# Patient Record
Sex: Female | Born: 1945
Health system: Southern US, Community
[De-identification: ages and names within clinical notes are randomized; demographics above are authoritative.]

## PROBLEM LIST (undated history)

## (undated) DIAGNOSIS — N3010 Interstitial cystitis (chronic) without hematuria: Secondary | ICD-10-CM

## (undated) HISTORY — PX: KIDNEY SURGERY: SHX687

## (undated) HISTORY — PX: VAGINAL HYSTERECTOMY: SUR661

## (undated) HISTORY — PX: BREAST LUMPECTOMY: SHX2

## (undated) HISTORY — DX: Interstitial cystitis (chronic) without hematuria: N30.10

---

## 2009-05-11 ENCOUNTER — Ambulatory Visit: Payer: Self-pay | Admitting: Orthopedic Surgery

## 2009-05-19 ENCOUNTER — Ambulatory Visit: Payer: Self-pay | Admitting: Orthopedic Surgery

## 2015-04-09 ENCOUNTER — Ambulatory Visit (INDEPENDENT_AMBULATORY_CARE_PROVIDER_SITE_OTHER): Payer: PPO | Admitting: Family Medicine

## 2015-04-09 ENCOUNTER — Encounter: Payer: Self-pay | Admitting: Family Medicine

## 2015-04-09 VITALS — BP 130/68 | HR 80 | Ht 65.0 in | Wt 159.0 lb

## 2015-04-09 DIAGNOSIS — Z7689 Persons encountering health services in other specified circumstances: Secondary | ICD-10-CM

## 2015-04-09 DIAGNOSIS — Z7189 Other specified counseling: Secondary | ICD-10-CM | POA: Diagnosis not present

## 2015-04-09 NOTE — Progress Notes (Signed)
Name: BASILIA STUCKERT   MRN: [id number]    DOB: 04-04-1946   Date:04/09/2015       Progress Note  Subjective  Chief Complaint  Chief Complaint  Patient presents with  . Establish Care    HPI Comments: Review aspects of medical aspects. No subjective or objective concerns.  Other This is a new (transition to care) problem. Associated symptoms include headaches. Pertinent negatives include no abdominal pain, anorexia, arthralgias, change in bowel habit, chest pain, chills, congestion, coughing, diaphoresis, fatigue, fever, joint swelling, myalgias, nausea, neck pain, numbness, rash, sore throat, swollen glands, urinary symptoms, vertigo, visual change, vomiting or weakness. Nothing aggravates the symptoms. She has tried nothing for the symptoms. The treatment provided no relief.    No problem-specific assessment & plan notes found for this encounter.   Past Medical History  Diagnosis Date  . Interstitial cystitis     Past Surgical History  Procedure Laterality Date  . Vaginal hysterectomy    . Kidney surgery      Family History  Problem Relation Age of Onset  . Hypertension Mother     History   Social History  . Marital Status: Married    Spouse Name: N/A  . Number of Children: N/A  . Years of Education: N/A   Occupational History  . Not on file.   Social History Main Topics  . Smoking status: Former Games developer  . Smokeless tobacco: Not on file  . Alcohol Use: 0.0 oz/week    0 Standard drinks or equivalent per week  . Drug Use: No  . Sexual Activity: Yes   Other Topics Concern  . Not on file   Social History Narrative  . No narrative on file    Allergies  Allergen Reactions  . Sulfa Antibiotics      Review of Systems  Constitutional: Negative for fever, chills, weight loss, malaise/fatigue, diaphoresis and fatigue.  HENT: Negative for congestion, ear discharge, ear pain and sore throat.   Eyes: Negative for blurred vision.  Respiratory: Negative for cough,  sputum production, shortness of breath and wheezing.   Cardiovascular: Negative for chest pain, palpitations and leg swelling.  Gastrointestinal: Negative for heartburn, nausea, vomiting, abdominal pain, diarrhea, constipation, blood in stool, melena, anorexia and change in bowel habit.  Genitourinary: Negative for dysuria, urgency, frequency and hematuria.       Hx of interstial cystitis  Musculoskeletal: Negative for myalgias, back pain, joint pain, joint swelling, arthralgias and neck pain.  Skin: Negative for rash.  Neurological: Positive for headaches. Negative for dizziness, vertigo, tingling, sensory change, focal weakness, weakness and numbness.  Endo/Heme/Allergies: Negative for environmental allergies and polydipsia. Does not bruise/bleed easily.  Psychiatric/Behavioral: Negative for depression and suicidal ideas. The patient is not nervous/anxious and does not have insomnia.      Objective  Filed Vitals:   04/09/15 1358  BP: 130/68  Pulse: 80  Height:  (1.651 m)  Weight: 159 lb (72.122 kg)    Physical Exam  Constitutional: She is well-developed, well-nourished, and in no distress. No distress.  HENT:  Head: Normocephalic and atraumatic.  Right Ear: External ear normal.  Left Ear: External ear normal.  Nose: Nose normal.  Mouth/Throat: Oropharynx is clear and moist.  Eyes: Conjunctivae and EOM are normal. Pupils are equal, round, and reactive to light. Right eye exhibits no discharge. Left eye exhibits no discharge.  Neck: Normal range of motion. Neck supple. No JVD present. No thyromegaly present.  Cardiovascular: Normal rate, regular rhythm, normal  heart sounds and intact distal pulses.  Exam reveals no gallop and no friction rub.   No murmur heard. Pulmonary/Chest: Effort normal and breath sounds normal. No respiratory distress. She has no wheezes.  Abdominal: Soft. Bowel sounds are normal. She exhibits no mass. There is no tenderness. There is no guarding.   Musculoskeletal: Normal range of motion. She exhibits no edema.  Lymphadenopathy:    She has no cervical adenopathy.  Neurological: She is alert. She has normal reflexes.  Skin: Skin is warm and dry. She is not diaphoretic.  Psychiatric: Mood and affect normal.  Nursing note and vitals reviewed.     Assessment & Plan  Problem List Items Addressed This Visit    None    Visit Diagnoses    Establishing care with new doctor, encounter for    -  Primary    Relevant Orders    Ambulatory referral to Gastroenterology         Dr. Elizabeth Sauer [facility]  04/09/2015

## 2015-07-21 ENCOUNTER — Ambulatory Visit (INDEPENDENT_AMBULATORY_CARE_PROVIDER_SITE_OTHER): Payer: PPO | Admitting: Family Medicine

## 2015-07-21 ENCOUNTER — Encounter: Payer: Self-pay | Admitting: Family Medicine

## 2015-07-21 VITALS — BP 122/80 | HR 72 | Ht 66.0 in | Wt 154.0 lb

## 2015-07-21 DIAGNOSIS — Z0000 Encounter for general adult medical examination without abnormal findings: Secondary | ICD-10-CM | POA: Diagnosis not present

## 2015-07-21 NOTE — Progress Notes (Signed)
Name: Heidi Bauer   MRN: [id number]    DOB: 23-Dec-1945   Date:07/21/2015       Progress Note  Subjective  Chief Complaint  Chief Complaint  Patient presents with  . Annual Exam    MAW    HPI Comments: Patient for medicare annual wellness.   No problem-specific assessment & plan notes found for this encounter.   Past Medical History  Diagnosis Date  . Interstitial cystitis     Past Surgical History  Procedure Laterality Date  . Vaginal hysterectomy    . Kidney surgery    . Breast lumpectomy      Family History  Problem Relation Age of Onset  . Hypertension Mother     Social History   Social History  . Marital Status: Married    Spouse Name: N/A  . Number of Children: N/A  . Years of Education: N/A   Occupational History  . Not on file.   Social History Main Topics  . Smoking status: Former Games developermoker  . Smokeless tobacco: Not on file  . Alcohol Use: 0.0 oz/week    0 Standard drinks or equivalent per week  . Drug Use: No  . Sexual Activity: Yes   Other Topics Concern  . Not on file   Social History Narrative    Allergies  Allergen Reactions  . Sulfa Antibiotics      Review of Systems  Constitutional: Negative for fever, chills, weight loss and malaise/fatigue.  HENT: Negative for ear discharge, ear pain and sore throat.   Eyes: Negative for blurred vision.  Respiratory: Negative for cough, sputum production, shortness of breath and wheezing.   Cardiovascular: Negative for chest pain, palpitations and leg swelling.  Gastrointestinal: Negative for heartburn, nausea, abdominal pain, diarrhea, constipation, blood in stool and melena.  Genitourinary: Negative for dysuria, urgency, frequency and hematuria.  Musculoskeletal: Positive for joint pain. Negative for myalgias, back pain and neck pain.  Skin: Negative for rash.  Neurological: Negative for dizziness, tingling, sensory change, focal weakness and headaches.  Endo/Heme/Allergies: Negative for  environmental allergies and polydipsia. Does not bruise/bleed easily.  Psychiatric/Behavioral: Negative for depression and suicidal ideas. The patient is not nervous/anxious and does not have insomnia.      Objective  Filed Vitals:   07/21/15 0957  BP: 122/80  Pulse: 72  Height: 5\' 6"  (1.676 m)  Weight: 154 lb (69.854 kg)      Physical Exam  Constitutional: She is well-developed, well-nourished, and in no distress. No distress.  HENT:  Head: Normocephalic and atraumatic.  Right Ear: External ear normal.  Left Ear: External ear normal.  Nose: Nose normal.  Mouth/Throat: Oropharynx is clear and moist.  Eyes: Conjunctivae and EOM are normal. Pupils are equal, round, and reactive to light. Right eye exhibits no discharge. Left eye exhibits no discharge.  Neck: Normal range of motion. Neck supple. No JVD present. No thyromegaly present.  Cardiovascular: Normal rate, regular rhythm, normal heart sounds and intact distal pulses.  Exam reveals no gallop and no friction rub.   No murmur heard. Pulmonary/Chest: Effort normal and breath sounds normal. Right breast exhibits no inverted nipple, no mass, no nipple discharge, no skin change and no tenderness. Left breast exhibits no inverted nipple, no mass, no nipple discharge, no skin change and no tenderness. Breasts are symmetrical.  Abdominal: Soft. Bowel sounds are normal. She exhibits no mass. There is no tenderness. There is no guarding.  Musculoskeletal: Normal range of motion. She exhibits no edema.  Lymphadenopathy:    She has no cervical adenopathy.  Neurological: She is alert. She has normal reflexes.  Skin: Skin is warm and dry. She is not diaphoretic.  Psychiatric: Mood and affect normal.  Nursing note and vitals reviewed.     Assessment & Plan  Problem List Items Addressed This Visit    None    Visit Diagnoses    Medicare annual wellness visit, subsequent    -  Primary    deperession/cognitive screen normal       Pt refused TDAP, Pneumonia vaccine, and flu vaccine. Also stated she would call us after first of year to discuss getting colonoscopy and mammogram   Dr. Elizabeth Sauer [facility]  07/21/2015

## 2015-07-21 NOTE — Patient Instructions (Signed)

## 2015-09-12 LAB — COLOGUARD: Cologuard: NEGATIVE

## 2017-04-18 DIAGNOSIS — R739 Hyperglycemia, unspecified: Secondary | ICD-10-CM | POA: Diagnosis not present

## 2017-04-18 DIAGNOSIS — K219 Gastro-esophageal reflux disease without esophagitis: Secondary | ICD-10-CM | POA: Diagnosis not present

## 2017-04-18 DIAGNOSIS — E782 Mixed hyperlipidemia: Secondary | ICD-10-CM | POA: Diagnosis not present

## 2017-04-18 DIAGNOSIS — D500 Iron deficiency anemia secondary to blood loss (chronic): Secondary | ICD-10-CM | POA: Diagnosis not present

## 2017-04-18 DIAGNOSIS — K5900 Constipation, unspecified: Secondary | ICD-10-CM | POA: Diagnosis not present

## 2017-04-23 ENCOUNTER — Ambulatory Visit (INDEPENDENT_AMBULATORY_CARE_PROVIDER_SITE_OTHER): Payer: PPO

## 2017-04-23 VITALS — BP 138/80 | HR 62 | Temp 97.7°F | Resp 16 | Ht 66.0 in | Wt 157.0 lb

## 2017-04-23 DIAGNOSIS — Z0000 Encounter for general adult medical examination without abnormal findings: Secondary | ICD-10-CM | POA: Diagnosis not present

## 2017-04-23 NOTE — Progress Notes (Signed)
Subjective:   Heidi Bauer is a 71 y.o. female who presents for Medicare Annual (Subsequent) preventive examination.  Review of Systems:  Cardiac Risk Factors include: advanced age (>3755men, 71>65 women)     Objective:     Vitals: BP 138/80 (BP Location: Right Arm, Patient Position: Sitting)   Pulse 62   Temp 97.7 F (36.5 C)   Resp 16   Ht 5\' 6"  (1.676 m)   Wt 157 lb (71.2 kg)   BMI 25.34 kg/m   Body mass index is 25.34 kg/m.   Tobacco History  Smoking Status  . Former Smoker  Smokeless Tobacco  . Never Used    Comment: unknown quit date      Counseling given: Not Answered   Past Medical History:  Diagnosis Date  . Interstitial cystitis    Past Surgical History:  Procedure Laterality Date  . BREAST LUMPECTOMY    . KIDNEY SURGERY    . VAGINAL HYSTERECTOMY     Family History  Problem Relation Age of Onset  . Hypertension Mother    History  Sexual Activity  . Sexual activity: Yes    Outpatient Encounter Prescriptions as of 04/23/2017  Medication Sig  . Naproxen Sodium (ALEVE) 220 MG CAPS Take by mouth. As needed   No facility-administered encounter medications on file as of 04/23/2017.     Activities of Daily Living In your present state of health, do you have any difficulty performing the following activities: 04/23/2017  Hearing? N  Vision? N  Difficulty concentrating or making decisions? N  Walking or climbing stairs? N  Dressing or bathing? N  Doing errands, shopping? N  Preparing Food and eating ? N  Using the Toilet? N  In the past six months, have you accidently leaked urine? N  Do you have problems with loss of bowel control? N  Managing your Medications? N  Managing your Finances? N  Housekeeping or managing your Housekeeping? N  Some recent data might be hidden    Patient Care Team: Duanne LimerickJones, Deanna C, MD as PCP - General (Family Medicine)    Assessment:     Exercise Activities and Dietary recommendations Current Exercise Habits:  Home exercise routine, Type of exercise: walking, Time (Minutes): 20, Frequency (Times/Week): 7, Weekly Exercise (Minutes/Week): 140, Intensity: Mild  Goals    None     Fall Risk Fall Risk  04/23/2017 07/21/2015 04/09/2015  Falls in the past year? No No No   Depression Screen PHQ 2/9 Scores 04/23/2017 07/21/2015 07/21/2015 04/09/2015  PHQ - 2 Score 0 0 0 0     Cognitive Function     6CIT Screen 04/23/2017  What Year? 0 points  What month? 0 points  What time? 0 points  Count back from 20 0 points  Months in reverse 0 points  Repeat phrase 0 points  Total Score 0     There is no immunization history on file for this patient. Screening Tests Health Maintenance  Topic Date Due  . INFLUENZA VACCINE  04/11/2017  . MAMMOGRAM  05/12/2018 (Originally 05/06/1997)  . DEXA SCAN  05/12/2018 (Originally 05/06/2012)  . COLONOSCOPY  05/12/2018 (Originally 05/06/1997)  . TETANUS/TDAP  05/12/2018 (Originally 05/06/1966)  . Hepatitis C Screening  05/12/2018 (Originally 01-07-1946)  . PNA vac Low Risk Adult (1 of 2 - PCV13) 05/12/2018 (Originally 05/06/2012)      Plan:     I have personally reviewed and addressed the Medicare Annual Wellness questionnaire and have noted the following in  the patient's chart:  A. Medical and social history B. Use of alcohol, tobacco or illicit drugs  C. Current medications and supplements D. Functional ability and status E.  Nutritional status F.  Physical activity G. Advance directives H. List of other physicians I.  Hospitalizations, surgeries, and ER visits in previous 12 months J.  Vitals K. Screenings such as hearing and vision if needed, cognitive and depression L. Referrals and appointments  In addition, I have reviewed and discussed with patient certain preventive protocols, quality metrics, and best practice recommendations. A written personalized care plan for preventive services as well as general preventive health recommendations were provided to  patient.   Signed,  Marin Roberts, LPN Nurse Health Advisor   MD Recommendations: requests cologuard

## 2017-04-23 NOTE — Patient Instructions (Signed)
[name] , Thank you for taking time to come for your Medicare Wellness Visit. I appreciate your ongoing commitment to your health goals. Please review the following plan we discussed and let me know if I can assist you in the future.   Screening recommendations/referrals: Colonoscopy: requests cologuard Mammogram: declined Bone Density: declined  Recommended yearly ophthalmology/optometry visit for glaucoma screening and checkup Recommended yearly dental visit for hygiene and checkup  Vaccinations: Influenza vaccine: up to date  Pneumococcal vaccine: declined Tdap vaccine: declined Shingles vaccine: declined  Advanced directives: Please bring a copy of your health care power of attorney and living will to the office at your convenience.  Conditions/risks identified: none   Next appointment: Follow up in one year for your annual wellness visit.    Preventive Care 1765 Years and Older, Female Preventive care refers to lifestyle choices and visits with your health care provider that can promote health and wellness. What does preventive care include?  A yearly physical exam. This is also called an annual well check.  Dental exams once or twice a year.  Routine eye exams. Ask your health care provider how often you should have your eyes checked.  Personal lifestyle choices, including:  Daily care of your teeth and gums.  Regular physical activity.  Eating a healthy diet.  Avoiding tobacco and drug use.  Limiting alcohol use.  Practicing safe sex.  Taking low-dose aspirin every day.  Taking vitamin and mineral supplements as recommended by your health care provider. What happens during an annual well check? The services and screenings done by your health care provider during your annual well check will depend on your age, overall health, lifestyle risk factors, and family history of disease. Counseling  Your health care provider may ask you questions about  your:  Alcohol use.  Tobacco use.  Drug use.  Emotional well-being.  Home and relationship well-being.  Sexual activity.  Eating habits.  History of falls.  Memory and ability to understand (cognition).  Work and work Astronomerenvironment.  Reproductive health. Screening  You may have the following tests or measurements:  Height, weight, and BMI.  Blood pressure.  Lipid and cholesterol levels. These may be checked every 5 years, or more frequently if you are over 70 years old.  Skin check.  Lung cancer screening. You may have this screening every year starting at age 70 if you have a 30-pack-year history of smoking and currently smoke or have quit within the past 15 years.  Fecal occult blood test (FOBT) of the stool. You may have this test every year starting at age 70.  Flexible sigmoidoscopy or colonoscopy. You may have a sigmoidoscopy every 5 years or a colonoscopy every 10 years starting at age 70.  Hepatitis C blood test.  Hepatitis B blood test.  Sexually transmitted disease (STD) testing.  Diabetes screening. This is done by checking your blood sugar (glucose) after you have not eaten for a while (fasting). You may have this done every 1-3 years.  Bone density scan. This is done to screen for osteoporosis. You may have this done starting at age 70.  Mammogram. This may be done every 1-2 years. Talk to your health care provider about how often you should have regular mammograms. Talk with your health care provider about your test results, treatment options, and if necessary, the need for more tests. Vaccines  Your health care provider may recommend certain vaccines, such as:  Influenza vaccine. This is recommended every year.  Tetanus,  diphtheria, and acellular pertussis (Tdap, Td) vaccine. You may need a Td booster every 10 years.  Zoster vaccine. You may need this after age 65.  Pneumococcal 13-valent conjugate (PCV13) vaccine. One dose is recommended  after age 30.  Pneumococcal polysaccharide (PPSV23) vaccine. One dose is recommended after age 71. Talk to your health care provider about which screenings and vaccines you need and how often you need them. This information is not intended to replace advice given to you by your health care provider. Make sure you discuss any questions you have with your health care provider. Document Released: 09/24/2015 Document Revised: 05/17/2016 Document Reviewed: 06/29/2015 Elsevier Interactive Patient Education  2017 [place] Prevention in the Home Falls can cause injuries. They can happen to people of all ages. There are many things you can do to make your home safe and to help prevent falls. What can I do on the outside of my home?  Regularly fix the edges of walkways and driveways and fix any cracks.  Remove anything that might make you trip as you walk through a door, such as a raised step or threshold.  Trim any bushes or trees on the path to your home.  Use bright outdoor lighting.  Clear any walking paths of anything that might make someone trip, such as rocks or tools.  Regularly check to see if handrails are loose or broken. Make sure that both sides of any steps have handrails.  Any raised decks and porches should have guardrails on the edges.  Have any leaves, snow, or ice cleared regularly.  Use sand or salt on walking paths during winter.  Clean up any spills in your garage right away. This includes oil or grease spills. What can I do in the bathroom?  Use night lights.  Install grab bars by the toilet and in the tub and shower. Do not use towel bars as grab bars.  Use non-skid mats or decals in the tub or shower.  If you need to sit down in the shower, use a plastic, non-slip stool.  Keep the floor dry. Clean up any water that spills on the floor as soon as it happens.  Remove soap buildup in the tub or shower regularly.  Attach bath mats securely with  double-sided non-slip rug tape.  Do not have throw rugs and other things on the floor that can make you trip. What can I do in the bedroom?  Use night lights.  Make sure that you have a light by your bed that is easy to reach.  Do not use any sheets or blankets that are too big for your bed. They should not hang down onto the floor.  Have a firm chair that has side arms. You can use this for support while you get dressed.  Do not have throw rugs and other things on the floor that can make you trip. What can I do in the kitchen?  Clean up any spills right away.  Avoid walking on wet floors.  Keep items that you use a lot in easy-to-reach places.  If you need to reach something above you, use a strong step stool that has a grab bar.  Keep electrical cords out of the way.  Do not use floor polish or wax that makes floors slippery. If you must use wax, use non-skid floor wax.  Do not have throw rugs and other things on the floor that can make you trip. What can I do with  my stairs?  Do not leave any items on the stairs.  Make sure that there are handrails on both sides of the stairs and use them. Fix handrails that are broken or loose. Make sure that handrails are as long as the stairways.  Check any carpeting to make sure that it is firmly attached to the stairs. Fix any carpet that is loose or worn.  Avoid having throw rugs at the top or bottom of the stairs. If you do have throw rugs, attach them to the floor with carpet tape.  Make sure that you have a light switch at the top of the stairs and the bottom of the stairs. If you do not have them, ask someone to add them for you. What else can I do to help prevent falls?  Wear shoes that:  Do not have high heels.  Have rubber bottoms.  Are comfortable and fit you well.  Are closed at the toe. Do not wear sandals.  If you use a stepladder:  Make sure that it is fully opened. Do not climb a closed stepladder.  Make  sure that both sides of the stepladder are locked into place.  Ask someone to hold it for you, if possible.  Clearly mark and make sure that you can see:  Any grab bars or handrails.  First and last steps.  Where the edge of each step is.  Use tools that help you move around (mobility aids) if they are needed. These include:  Canes.  Walkers.  Scooters.  Crutches.  Turn on the lights when you go into a dark area. Replace any light bulbs as soon as they burn out.  Set up your furniture so you have a clear path. Avoid moving your furniture around.  If any of your floors are uneven, fix them.  If there are any pets around you, be aware of where they are.  Review your medicines with your doctor. Some medicines can make you feel dizzy. This can increase your chance of falling. Ask your doctor what other things that you can do to help prevent falls. This information is not intended to replace advice given to you by your health care provider. Make sure you discuss any questions you have with your health care provider. Document Released: 06/24/2009 Document Revised: 02/03/2016 Document Reviewed: 10/02/2014 Elsevier Interactive Patient Education  2017 [employer].

## 2017-07-10 ENCOUNTER — Other Ambulatory Visit: Payer: Self-pay

## 2018-01-15 ENCOUNTER — Other Ambulatory Visit: Payer: Self-pay

## 2018-04-24 ENCOUNTER — Ambulatory Visit (INDEPENDENT_AMBULATORY_CARE_PROVIDER_SITE_OTHER): Payer: PPO

## 2018-04-24 VITALS — BP 128/70 | HR 78 | Temp 98.0°F | Resp 12 | Ht 66.0 in | Wt 164.8 lb

## 2018-04-24 DIAGNOSIS — Z0000 Encounter for general adult medical examination without abnormal findings: Secondary | ICD-10-CM | POA: Diagnosis not present

## 2018-04-24 NOTE — Patient Instructions (Signed)
Heidi Bauer , Thank you for taking time to come for your Medicare Wellness Visit. I appreciate your ongoing commitment to your health goals. Please review the following plan we discussed and let me know if I can assist you in the future.   Screening recommendations/referrals: Colorectal Screening: Declined Mammogram: Declined Bone Density: Declined  Vision and Dental Exams: Recommended annual ophthalmology exams for early detection of glaucoma and other disorders of the eye Recommended annual dental exams for proper oral hygiene  Vaccinations: Influenza vaccine: Overdue Pneumococcal vaccine: Declined Tdap vaccine: Declined. Please call your insurance company to determine your out of pocket expense. You may also receive this vaccine at your local pharmacy or Health Dept. Shingles vaccine: Please call your insurance company to determine your out of pocket expense for the Shingrix vaccine. You may also receive this vaccine at your local pharmacy or Health Dept.  Advanced directives: Please bring a copy of your POA (Power of Attorney) and/or Living Will to your next appointment.  Goals: Recommend to drink at least 6-8 8oz glasses of water per day.  Next appointment: Please schedule your Annual Wellness Visit with your Nurse Health Advisor in one year.  Preventive Care 40-64 Years, Female Preventive care refers to lifestyle choices and visits with your health care provider that can promote health and wellness. What does preventive care include?  A yearly physical exam. This is also called an annual well check.  Dental exams once or twice a year.  Routine eye exams. Ask your health care provider how often you should have your eyes checked.  Personal lifestyle choices, including:  Daily care of your teeth and gums.  Regular physical activity.  Eating a healthy diet.  Avoiding tobacco and drug use.  Limiting alcohol use.  Practicing safe sex.  Taking low-dose aspirin daily  starting at age 32.  Taking vitamin and mineral supplements as recommended by your health care provider. What happens during an annual well check? The services and screenings done by your health care provider during your annual well check will depend on your age, overall health, lifestyle risk factors, and family history of disease. Counseling  Your health care provider may ask you questions about your:  Alcohol use.  Tobacco use.  Drug use.  Emotional well-being.  Home and relationship well-being.  Sexual activity.  Eating habits.  Work and work Statistician.  Method of birth control.  Menstrual cycle.  Pregnancy history. Screening  You may have the following tests or measurements:  Height, weight, and BMI.  Blood pressure.  Lipid and cholesterol levels. These may be checked every 5 years, or more frequently if you are over 65 years old.  Skin check.  Lung cancer screening. You may have this screening every year starting at age 37 if you have a 30-pack-year history of smoking and currently smoke or have quit within the past 15 years.  Fecal occult blood test (FOBT) of the stool. You may have this test every year starting at age 29.  Flexible sigmoidoscopy or colonoscopy. You may have a sigmoidoscopy every 5 years or a colonoscopy every 10 years starting at age 57.  Hepatitis C blood test.  Hepatitis B blood test.  Sexually transmitted disease (STD) testing.  Diabetes screening. This is done by checking your blood sugar (glucose) after you have not eaten for a while (fasting). You may have this done every 1-3 years.  Mammogram. This may be done every 1-2 years. Talk to your health care provider about when you should  start having regular mammograms. This may depend on whether you have a family history of breast cancer.  BRCA-related cancer screening. This may be done if you have a family history of breast, ovarian, tubal, or peritoneal cancers.  Pelvic exam and  Pap test. This may be done every 3 years starting at age 24. Starting at age 47, this may be done every 5 years if you have a Pap test in combination with an HPV test.  Bone density scan. This is done to screen for osteoporosis. You may have this scan if you are at high risk for osteoporosis. Discuss your test results, treatment options, and if necessary, the need for more tests with your health care provider. Vaccines  Your health care provider may recommend certain vaccines, such as:  Influenza vaccine. This is recommended every year.  Tetanus, diphtheria, and acellular pertussis (Tdap, Td) vaccine. You may need a Td booster every 10 years.  Zoster vaccine. You may need this after age 49.  Pneumococcal 13-valent conjugate (PCV13) vaccine. You may need this if you have certain conditions and were not previously vaccinated.  Pneumococcal polysaccharide (PPSV23) vaccine. You may need one or two doses if you smoke cigarettes or if you have certain conditions. Talk to your health care provider about which screenings and vaccines you need and how often you need them. This information is not intended to replace advice given to you by your health care provider. Make sure you discuss any questions you have with your health care provider. Document Released: 09/24/2015 Document Revised: 05/17/2016 Document Reviewed: 06/29/2015 Elsevier Interactive Patient Education  2017 [place] Prevention in the Home Falls can cause injuries. They can happen to people of all ages. There are many things you can do to make your home safe and to help prevent falls. What can I do on the outside of my home?  Regularly fix the edges of walkways and driveways and fix any cracks.  Remove anything that might make you trip as you walk through a door, such as a raised step or threshold.  Trim any bushes or trees on the path to your home.  Use bright outdoor lighting.  Clear any walking paths of  anything that might make someone trip, such as rocks or tools.  Regularly check to see if handrails are loose or broken. Make sure that both sides of any steps have handrails.  Any raised decks and porches should have guardrails on the edges.  Have any leaves, snow, or ice cleared regularly.  Use sand or salt on walking paths during winter.  Clean up any spills in your garage right away. This includes oil or grease spills. What can I do in the bathroom?  Use night lights.  Install grab bars by the toilet and in the tub and shower. Do not use towel bars as grab bars.  Use non-skid mats or decals in the tub or shower.  If you need to sit down in the shower, use a plastic, non-slip stool.  Keep the floor dry. Clean up any water that spills on the floor as soon as it happens.  Remove soap buildup in the tub or shower regularly.  Attach bath mats securely with double-sided non-slip rug tape.  Do not have throw rugs and other things on the floor that can make you trip. What can I do in the bedroom?  Use night lights.  Make sure that you have a light by your bed that is easy  to reach.  Do not use any sheets or blankets that are too big for your bed. They should not hang down onto the floor.  Have a firm chair that has side arms. You can use this for support while you get dressed.  Do not have throw rugs and other things on the floor that can make you trip. What can I do in the kitchen?  Clean up any spills right away.  Avoid walking on wet floors.  Keep items that you use a lot in easy-to-reach places.  If you need to reach something above you, use a strong step stool that has a grab bar.  Keep electrical cords out of the way.  Do not use floor polish or wax that makes floors slippery. If you must use wax, use non-skid floor wax.  Do not have throw rugs and other things on the floor that can make you trip. What can I do with my stairs?  Do not leave any items on the  stairs.  Make sure that there are handrails on both sides of the stairs and use them. Fix handrails that are broken or loose. Make sure that handrails are as long as the stairways.  Check any carpeting to make sure that it is firmly attached to the stairs. Fix any carpet that is loose or worn.  Avoid having throw rugs at the top or bottom of the stairs. If you do have throw rugs, attach them to the floor with carpet tape.  Make sure that you have a light switch at the top of the stairs and the bottom of the stairs. If you do not have them, ask someone to add them for you. What else can I do to help prevent falls?  Wear shoes that:  Do not have high heels.  Have rubber bottoms.  Are comfortable and fit you well.  Are closed at the toe. Do not wear sandals.  If you use a stepladder:  Make sure that it is fully opened. Do not climb a closed stepladder.  Make sure that both sides of the stepladder are locked into place.  Ask someone to hold it for you, if possible.  Clearly mark and make sure that you can see:  Any grab bars or handrails.  First and last steps.  Where the edge of each step is.  Use tools that help you move around (mobility aids) if they are needed. These include:  Canes.  Walkers.  Scooters.  Crutches.  Turn on the lights when you go into a dark area. Replace any light bulbs as soon as they burn out.  Set up your furniture so you have a clear path. Avoid moving your furniture around.  If any of your floors are uneven, fix them.  If there are any pets around you, be aware of where they are.  Review your medicines with your doctor. Some medicines can make you feel dizzy. This can increase your chance of falling. Ask your doctor what other things that you can do to help prevent falls. This information is not intended to replace advice given to you by your health care provider. Make sure you discuss any questions you have with your health care  provider. Document Released: 06/24/2009 Document Revised: 02/03/2016 Document Reviewed: 10/02/2014 Elsevier Interactive Patient Education  2017 [employer].

## 2018-04-24 NOTE — Progress Notes (Signed)
Subjective:   Heidi Bauer is a 72 y.o. female who presents for Medicare Annual (Subsequent) preventive examination.  Review of Systems:  N/A Cardiac Risk Factors include: advanced age (>4855men, 5>65 women)     Objective:     Vitals: BP 128/70 (BP Location: Right Arm, Patient Position: Sitting, Cuff Size: Normal)   Pulse 78   Temp 98 F (36.7 C) (Oral)   Resp 12   Ht 5\' 6"  (1.676 m)   Wt 164 lb 12.8 oz (74.8 kg)   SpO2 96%   BMI 26.60 kg/m   Body mass index is 26.6 kg/m.  Advanced Directives 04/24/2018 04/23/2017 07/21/2015 04/09/2015  Does Patient Have a Medical Advance Directive? Yes Yes Yes Yes  Type of Estate agentAdvance Directive Healthcare Power of [place];Living will Healthcare Power of [place];Living will Healthcare Power of [place];Living will Living will  Copy of Healthcare Power of Attorney in Chart? No - copy requested No - copy requested No - copy requested No - copy requested    Tobacco Social History   Tobacco Use  Smoking Status Former Smoker  . Packs/day: 0.25  . Years: 15.00  . Pack years: 3.75  . Types: Cigarettes  . Last attempt to quit: 2004  . Years since quitting: 15.6  Smokeless Tobacco Never Used  Tobacco Comment   smoking cessation materials not required     Counseling given: No Comment: smoking cessation materials not required  Clinical Intake:  Pre-visit preparation completed: Yes  Pain : No/denies pain   BMI - recorded: 26.6 Nutritional Status: BMI 25 -29 Overweight Nutritional Risks: None Diabetes: No  How often do you need to have someone help you when you read instructions, pamphlets, or other written materials from your doctor or pharmacy?: 1 - Never  Interpreter Needed?: No  Information entered by :: AEversole, LPN  Past Medical History:  Diagnosis Date  . Interstitial cystitis    Past Surgical History:  Procedure Laterality Date  . BREAST LUMPECTOMY    . KIDNEY SURGERY    . VAGINAL HYSTERECTOMY     Family History    Problem Relation Age of Onset  . Hypertension Mother    Social History   Socioeconomic History  . Marital status: Married    Spouse name: Not on file  . Number of children: 2  . Years of education: Not on file  . Highest education level: Associate degree: academic program  Occupational History  . Occupation: Retired  Engineer, productionocial Needs  . Financial resource strain: Not hard at all  . Food insecurity:    Worry: Never true    Inability: Never true  . Transportation needs:    Medical: No    Non-medical: No  Tobacco Use  . Smoking status: Former Smoker    Packs/day: 0.25    Years: 15.00    Pack years: 3.75    Types: Cigarettes    Last attempt to quit: 2004    Years since quitting: 15.6  . Smokeless tobacco: Never Used  . Tobacco comment: smoking cessation materials not required  Substance and Sexual Activity  . Alcohol use: Not Currently    Alcohol/week: 0.0 standard drinks  . Drug use: No  . Sexual activity: Yes  Lifestyle  . Physical activity:    Days per week: 7 days    Minutes per session: 30 min  . Stress: Not at all  Relationships  . Social connections:    Talks on phone: Patient refused    Gets together: Patient refused  Attends religious service: Patient refused    Active member of club or organization: Patient refused    Attends meetings of clubs or organizations: Patient refused    Relationship status: Married  Other Topics Concern  . Not on file  Social History Narrative  . Not on file    Outpatient Encounter Medications as of 04/24/2018  Medication Sig  . Naproxen Sodium (ALEVE) 220 MG CAPS Take by mouth. As needed   No facility-administered encounter medications on file as of 04/24/2018.     Activities of Daily Living In your present state of health, do you have any difficulty performing the following activities: 04/24/2018  Hearing? N  Comment denies hearing aids  Vision? N  Comment wears eyeglasses  Difficulty concentrating or making  decisions? N  Walking or climbing stairs? N  Dressing or bathing? N  Doing errands, shopping? N  Preparing Food and eating ? N  Comment full set upper and lower dentures  Using the Toilet? N  In the past six months, have you accidently leaked urine? N  Do you have problems with loss of bowel control? N  Managing your Medications? N  Managing your Finances? N  Housekeeping or managing your Housekeeping? N  Some recent data might be hidden    Patient Care Team: Duanne Limerick, MD as PCP - General (Family Medicine)    Assessment:   This is a routine wellness examination for [facility].  Exercise Activities and Dietary recommendations Current Exercise Habits: The patient does not participate in regular exercise at present;Home exercise routine, Type of exercise: walking, Time (Minutes): 30, Frequency (Times/Week): 7, Weekly Exercise (Minutes/Week): 210, Intensity: Mild, Exercise limited by: None identified  Goals    . DIET - INCREASE WATER INTAKE     Recommend to drink at least 6-8 8oz glasses of water per day.       Fall Risk Fall Risk  04/24/2018 04/23/2017 07/21/2015 04/09/2015  Falls in the past year? No No No No  Risk for fall due to : Impaired vision - - -  Risk for fall due to: Comment wears eyeglasses - - -   FALL RISK PREVENTION PERTAINING TO HOME: Is your home free of loose throw rugs in walkways, pet beds, electrical cords, etc? Yes Is there adequate lighting in your home to reduce risk of falls?  Yes Are there stairs in or around your home WITH handrails? Yes  ASSISTIVE DEVICES UTILIZED TO PREVENT FALLS: Use of a cane, walker or w/c? No Grab bars in the bathroom? No  Shower chair or a place to sit while bathing? No An elevated toilet seat or a handicapped toilet? No  Timed Get Up and Go Performed: Yes. Pt ambulated 10 feet within 5 sec. Gait stead-fast and without the use of an assistive device. No intervention required at this time. Fall risk prevention has been  discussed.  Community Resource Referral:  Pt declined my offer to send [employer] Referral to Care Guide for installation of grab bars in the shower, shower chair or an elevated toilet seat.  Depression Screen PHQ 2/9 Scores 04/24/2018 04/23/2017 07/21/2015 07/21/2015  PHQ - 2 Score 0 0 0 0  PHQ- 9 Score 0 - - -     Cognitive Function     6CIT Screen 04/24/2018 04/23/2017  What Year? 0 points 0 points  What month? 0 points 0 points  What time? 0 points 0 points  Count back from 20 0 points 0 points  Months in reverse  0 points 0 points  Repeat phrase 0 points 0 points  Total Score 0 0     There is no immunization history on file for this patient.  Qualifies for Shingles Vaccine? Yes. Due for Shingrix. Education has been provided regarding the importance of this vaccine. Pt has been advised to call insurance company to determine out of pocket expense. Advised may also receive vaccine at local pharmacy or Health Dept. Verbalized acceptance and understanding.  Overdue for Flu vaccine. Education has been provided regarding the importance of this vaccine and advised to receive when available. Verbalized acceptance and understanding.  Due for Pneumoccocal vaccine. Declined my offer to administer today. Education has been provided regarding the importance of this vaccine but still declined. Advised may receive this vaccine at local pharmacy or Health Dept. Aware to provide a copy of the vaccination record if obtained from local pharmacy or Health Dept. Verbalized acceptance and understanding.  Due for Tdap vaccine. Education has been provided regarding the importance of this vaccine. Advised may receive this vaccine at local pharmacy or Health Dept. Aware to provide a copy of the vaccination record if obtained from local pharmacy or Health Dept. Verbalized acceptance and understanding.   Screening Tests Health Maintenance  Topic Date Due  . INFLUENZA VACCINE  04/11/2018  .  MAMMOGRAM  05/12/2018 (Originally 05/06/1997)  . DEXA SCAN  05/12/2018 (Originally 05/06/2012)  . COLONOSCOPY  05/12/2018 (Originally 05/06/1997)  . TETANUS/TDAP  05/12/2018 (Originally 05/06/1966)  . Hepatitis C Screening  05/12/2018 (Originally 03/15/1946)  . PNA vac Low Risk Adult (1 of 2 - PCV13) 05/12/2018 (Originally 05/06/2012)    Cancer Screenings: Lung: Low Dose CT Chest recommended if Age 75-80 years, 30 pack-year currently smoking OR have quit w/in 15years. Patient does not qualify. Breast:  Up to date on Mammogram? No. Declined   Up to date of Bone Density/Dexa? No. Declined Colorectal: Declined  Additional Screenings: Hepatitis C Screening: Declined    Plan:  I have personally reviewed and addressed the Medicare Annual Wellness questionnaire and have noted the following in the patient's chart:  A. Medical and social history B. Use of alcohol, tobacco or illicit drugs  C. Current medications and supplements D. Functional ability and status E.  Nutritional status F.  Physical activity G. Advance directives H. List of other physicians I.  Hospitalizations, surgeries, and ER visits in previous 12 months J.  Vitals K. Screenings such as hearing and vision if needed, cognitive and depression L. Referrals and appointments  In addition, I have reviewed and discussed with patient certain preventive protocols, quality metrics, and best practice recommendations. A written personalized care plan for preventive services as well as general preventive health recommendations were provided to patient.  Signed,  Deon Pilling, LPN Nurse Health Advisor  MD Recommendations: Due for Shingrix. Education has been provided regarding the importance of this vaccine. Pt has been advised to call insurance company to determine out of pocket expense. Advised may also receive vaccine at local pharmacy or Health Dept. Verbalized acceptance and understanding.  Overdue for Flu vaccine. Education has  been provided regarding the importance of this vaccine and advised to receive when available. Verbalized acceptance and understanding.  Due for Pneumoccocal vaccine. Declined my offer to administer today. Education has been provided regarding the importance of this vaccine but still declined. Advised may receive this vaccine at local pharmacy or Health Dept. Aware to provide a copy of the vaccination record if obtained from local pharmacy or Health Dept. Verbalized acceptance  and understanding.  Due for Tdap vaccine. Education has been provided regarding the importance of this vaccine. Advised may receive this vaccine at local pharmacy or Health Dept. Aware to provide a copy of the vaccination record if obtained from local pharmacy or Health Dept. Verbalized acceptance and understanding.

## 2019-04-28 ENCOUNTER — Ambulatory Visit: Payer: PPO

## 2019-05-01 ENCOUNTER — Encounter: Payer: PPO | Admitting: Family Medicine

## 2019-05-05 DIAGNOSIS — Z20828 Contact with and (suspected) exposure to other viral communicable diseases: Secondary | ICD-10-CM | POA: Diagnosis not present

## 2019-05-06 DIAGNOSIS — Z20828 Contact with and (suspected) exposure to other viral communicable diseases: Secondary | ICD-10-CM | POA: Diagnosis not present

## 2019-05-08 ENCOUNTER — Ambulatory Visit (INDEPENDENT_AMBULATORY_CARE_PROVIDER_SITE_OTHER): Payer: PPO | Admitting: Family Medicine

## 2019-05-08 ENCOUNTER — Other Ambulatory Visit: Payer: Self-pay

## 2019-05-08 ENCOUNTER — Encounter: Payer: Self-pay | Admitting: Family Medicine

## 2019-05-08 VITALS — BP 130/80 | HR 80 | Ht 66.0 in | Wt 165.0 lb

## 2019-05-08 DIAGNOSIS — Z7689 Persons encountering health services in other specified circumstances: Secondary | ICD-10-CM | POA: Diagnosis not present

## 2019-05-08 NOTE — Progress Notes (Signed)
Date:  05/08/2019   Name:  Heidi Bauer   DOB:  [birth year]   MRN:  [id number]   Chief Complaint: [place] (get re-established)  Patient is a 73 year old female who presents for an establish care exam. The patient reports the following problems: none. Health maintenance has been reviewed up to date.   Review of Systems  Constitutional: Negative.  Negative for chills, fatigue, fever and unexpected weight change.  HENT: Negative for congestion, ear discharge, ear pain, rhinorrhea, sinus pressure, sneezing and sore throat.   Eyes: Negative for photophobia, pain, discharge, redness and itching.  Respiratory: Negative for cough, shortness of breath, wheezing and stridor.   Gastrointestinal: Negative for abdominal pain, blood in stool, constipation, diarrhea, nausea and vomiting.  Endocrine: Negative for cold intolerance, heat intolerance, polydipsia, polyphagia and polyuria.  Genitourinary: Negative for dysuria, flank pain, frequency, hematuria, menstrual problem, pelvic pain, urgency, vaginal bleeding and vaginal discharge.  Musculoskeletal: Negative for arthralgias, back pain and myalgias.  Skin: Negative for rash.  Allergic/Immunologic: Negative for environmental allergies and food allergies.  Neurological: Negative for dizziness, weakness, light-headedness, numbness and headaches.  Hematological: Negative for adenopathy. Does not bruise/bleed easily.  Psychiatric/Behavioral: Negative for dysphoric mood. The patient is not nervous/anxious.     There are no active problems to display for this patient.   Allergies  Allergen Reactions  . Sulfa Antibiotics     Past Surgical History:  Procedure Laterality Date  . BREAST LUMPECTOMY    . KIDNEY SURGERY    . VAGINAL HYSTERECTOMY      Social History   Tobacco Use  . Smoking status: Former Smoker    Packs/day: 0.25    Years: 15.00    Pack years: 3.75    Types: Cigarettes    Quit date: 2004    Years since quitting: 16.6   . Smokeless tobacco: Never Used  . Tobacco comment: smoking cessation materials not required  Substance Use Topics  . Alcohol use: Not Currently    Alcohol/week: 0.0 standard drinks  . Drug use: No     Medication list has been reviewed and updated.  Current Meds  Medication Sig  . Naproxen Sodium (ALEVE) 220 MG CAPS Take by mouth. As needed    PHQ 2/9 Scores 05/08/2019 04/24/2018 04/23/2017 07/21/2015  PHQ - 2 Score 0 0 0 0  PHQ- 9 Score 0 0 - -    BP Readings from Last 3 Encounters:  05/08/19 130/80  04/24/18 128/70  04/23/17 138/80    Physical Exam Vitals signs and nursing note reviewed.  Constitutional:      Appearance: She is well-developed.  HENT:     Head: Normocephalic.     Right Ear: Tympanic membrane, ear canal and external ear normal.     Left Ear: Tympanic membrane, ear canal and external ear normal.     Nose: Nose normal.     Mouth/Throat:     Mouth: Mucous membranes are moist.  Eyes:     General: Lids are everted, no foreign bodies appreciated. No scleral icterus.       Left eye: No foreign body or hordeolum.     Conjunctiva/sclera: Conjunctivae normal.     Right eye: Right conjunctiva is not injected.     Left eye: Left conjunctiva is not injected.     Pupils: Pupils are equal, round, and reactive to light.  Neck:     Musculoskeletal: Normal range of motion and neck supple.     Thyroid:  No thyromegaly.     Vascular: No JVD.     Trachea: No tracheal deviation.  Cardiovascular:     Rate and Rhythm: Normal rate and regular rhythm.     Pulses: Normal pulses.     Heart sounds: Normal heart sounds. No murmur. No friction rub. No gallop.   Pulmonary:     Effort: Pulmonary effort is normal. No respiratory distress.     Breath sounds: Normal breath sounds. No wheezing, rhonchi or rales.  Abdominal:     General: Bowel sounds are normal.     Palpations: Abdomen is soft. There is no mass.     Tenderness: There is no abdominal tenderness. There is no guarding  or rebound.  Musculoskeletal: Normal range of motion.        General: No tenderness.  Lymphadenopathy:     Cervical: No cervical adenopathy.  Skin:    General: Skin is warm.     Capillary Refill: Capillary refill takes less than 2 seconds.     Findings: No rash.  Neurological:     General: No focal deficit present.     Mental Status: She is alert and oriented to person, place, and time.     Cranial Nerves: No cranial nerve deficit.     Deep Tendon Reflexes: Reflexes normal.  Psychiatric:        Mood and Affect: Mood is not anxious or depressed.     Wt Readings from Last 3 Encounters:  05/08/19 165 lb (74.8 kg)  04/24/18 164 lb 12.8 oz (74.8 kg)  04/23/17 157 lb (71.2 kg)    BP 130/80   Pulse 80   Ht 5\' 6"  (1.676 m)   Wt 165 lb (74.8 kg)   BMI 26.63 kg/m   Assessment and Plan: 1. Establishing care with new doctor, encounter for Patient reestablishes care with physician.  Patient relates no medical concerns and although there are multiple areas that need to be updated in her health maintenance patient does not want to pursue any further proceeding of maintenance care.

## 2019-07-10 ENCOUNTER — Telehealth: Payer: Self-pay | Admitting: Family Medicine

## 2019-07-10 NOTE — Telephone Encounter (Signed)
Called to schedule Medicare Annual Wellness Visit with Nurse Health Advisor, Kasey Uthus at [facility]. If patient returns call, please schedule AWV with [facility] ~ Any date on [facility] schedule (Mon or Wed)  °Questions regarding scheduling, please call  [phone] or Skype > [email or web address]  ° °Kathryn Brown  °Care Guide • [facility] °[facility]  °??[email or web address]   ??[phone]   1Skype ° ° °

## 2019-08-16 DIAGNOSIS — Z20828 Contact with and (suspected) exposure to other viral communicable diseases: Secondary | ICD-10-CM | POA: Diagnosis not present

## 2019-08-29 ENCOUNTER — Telehealth: Payer: Self-pay

## 2019-08-29 NOTE — Telephone Encounter (Signed)
Pt returned call/ refuses mammo and colonoscopy

## 2019-10-06 DIAGNOSIS — H2513 Age-related nuclear cataract, bilateral: Secondary | ICD-10-CM | POA: Diagnosis not present

## 2019-10-15 DIAGNOSIS — Z20822 Contact with and (suspected) exposure to covid-19: Secondary | ICD-10-CM | POA: Diagnosis not present

## 2020-01-06 ENCOUNTER — Ambulatory Visit (INDEPENDENT_AMBULATORY_CARE_PROVIDER_SITE_OTHER): Payer: PPO | Admitting: Family Medicine

## 2020-01-06 ENCOUNTER — Encounter: Payer: Self-pay | Admitting: Family Medicine

## 2020-01-06 ENCOUNTER — Other Ambulatory Visit: Payer: Self-pay

## 2020-01-06 VITALS — BP 120/70 | HR 72 | Ht 66.0 in | Wt 165.0 lb

## 2020-01-06 DIAGNOSIS — Z1211 Encounter for screening for malignant neoplasm of colon: Secondary | ICD-10-CM | POA: Diagnosis not present

## 2020-01-06 DIAGNOSIS — Z780 Asymptomatic menopausal state: Secondary | ICD-10-CM | POA: Diagnosis not present

## 2020-01-06 DIAGNOSIS — Z0000 Encounter for general adult medical examination without abnormal findings: Secondary | ICD-10-CM

## 2020-01-06 NOTE — Progress Notes (Signed)
Date:  01/06/2020   Name:  Heidi Bauer   DOB:  [birth year]   MRN:  [id number]   Chief Complaint: Follow-up (check up for b/p)  Patient is a 74 year old female who presents for a comprehensive physical exam. The patient reports the following problems: none. Health maintenance has been reviewed colon/refuses  Mammogram/refuses  Dexascan/    No results found for: CREATININE, BUN, NA, K, CL, CO2 No results found for: CHOL, HDL, LDLCALC, LDLDIRECT, TRIG, CHOLHDL No results found for: TSH No results found for: HGBA1C No results found for: WBC, HGB, HCT, MCV, PLT No results found for: ALT, AST, GGT, ALKPHOS, BILITOT   Review of Systems  Constitutional: Negative.  Negative for chills, fatigue, fever and unexpected weight change.  HENT: Negative for congestion, ear discharge, ear pain, rhinorrhea, sinus pressure, sneezing and sore throat.   Eyes: Negative for photophobia, pain, discharge, redness and itching.  Respiratory: Negative for cough, shortness of breath, wheezing and stridor.   Cardiovascular: Negative for chest pain, palpitations and leg swelling.  Gastrointestinal: Negative for abdominal pain, blood in stool, constipation, diarrhea, nausea and vomiting.  Endocrine: Negative for cold intolerance, heat intolerance, polydipsia, polyphagia and polyuria.  Genitourinary: Negative for dysuria, flank pain, frequency, hematuria, menstrual problem, pelvic pain, urgency, vaginal bleeding and vaginal discharge.  Musculoskeletal: Negative for arthralgias, back pain and myalgias.  Skin: Negative for rash.  Allergic/Immunologic: Negative for environmental allergies and food allergies.  Neurological: Negative for dizziness, weakness, light-headedness, numbness and headaches.  Hematological: Negative for adenopathy. Does not bruise/bleed easily.  Psychiatric/Behavioral: Negative for dysphoric mood. The patient is not nervous/anxious.     There are no problems to display for this  patient.   Allergies  Allergen Reactions  . Sulfa Antibiotics     Past Surgical History:  Procedure Laterality Date  . BREAST LUMPECTOMY    . KIDNEY SURGERY    . VAGINAL HYSTERECTOMY      Social History   Tobacco Use  . Smoking status: Former Smoker    Packs/day: 0.25    Years: 15.00    Pack years: 3.75    Types: Cigarettes    Quit date: 2004    Years since quitting: 17.3  . Smokeless tobacco: Never Used  . Tobacco comment: smoking cessation materials not required  Substance Use Topics  . Alcohol use: Not Currently    Alcohol/week: 0.0 standard drinks  . Drug use: No     Medication list has been reviewed and updated.  Current Meds  Medication Sig  . Naproxen Sodium (ALEVE) 220 MG CAPS Take by mouth. As needed    PHQ 2/9 Scores 01/06/2020 05/08/2019 04/24/2018 04/23/2017  PHQ - 2 Score 0 0 0 0  PHQ- 9 Score 0 0 0 -    BP Readings from Last 3 Encounters:  01/06/20 120/70  05/08/19 130/80  04/24/18 128/70    Physical Exam Vitals and nursing note reviewed.  Constitutional:      General: She is not in acute distress.    Appearance: She is not diaphoretic.  HENT:     Head: Normocephalic and atraumatic.     Right Ear: Tympanic membrane, ear canal and external ear normal.     Left Ear: Tympanic membrane, ear canal and external ear normal.     Nose: Nose normal.  Eyes:     General:        Right eye: No discharge.        Left eye: No discharge.  Conjunctiva/sclera: Conjunctivae normal.     Pupils: Pupils are equal, round, and reactive to light.  Neck:     Thyroid: No thyromegaly.     Vascular: No JVD.  Cardiovascular:     Rate and Rhythm: Normal rate and regular rhythm.     Heart sounds: Normal heart sounds. No murmur. No friction rub. No gallop.   Pulmonary:     Effort: Pulmonary effort is normal.     Breath sounds: Normal breath sounds. No wheezing or rhonchi.  Abdominal:     General: Bowel sounds are normal.     Palpations: Abdomen is soft. There  is no hepatomegaly, splenomegaly or mass.     Tenderness: There is no abdominal tenderness. There is no guarding.  Musculoskeletal:        General: Normal range of motion.     Cervical back: Normal range of motion and neck supple.  Lymphadenopathy:     Cervical: No cervical adenopathy.  Skin:    General: Skin is warm and dry.     Coloration: Pallor:   Neurological:     Mental Status: She is alert.     Deep Tendon Reflexes: Reflexes are normal and symmetric.     Wt Readings from Last 3 Encounters:  01/06/20 165 lb (74.8 kg)  05/08/19 165 lb (74.8 kg)  04/24/18 164 lb 12.8 oz (74.8 kg)    BP 120/70   Pulse 72   Ht 5\' 6"  (1.676 m)   Wt 165 lb (74.8 kg)   BMI 26.63 kg/m   Assessment and Plan:    1. Health care maintenance Immunizations are reviewed and recommendations provided.   Age appropriate screening tests are discussed. Counseling given for risk factor reduction interventions.  2. Menopause Patient is postmenopausal and we will check for DEXA scan to rule out osteoporosis.  We have discussed with patient in the event that she does not wish to take medication that we can use supplemental vitamin D with calcium. - DG Bone Density; Future  3. Colon cancer screening Upon discussion with patient about colon cancer screening she has had Cologuard in the past but would prefer to proceed with the testing for fecal immunoassay - Fecal occult blood, imunochemical

## 2020-01-15 ENCOUNTER — Other Ambulatory Visit: Payer: Self-pay

## 2020-01-19 ENCOUNTER — Ambulatory Visit
Admission: RE | Admit: 2020-01-19 | Discharge: 2020-01-19 | Disposition: A | Discharge status: Home / Self Care | Payer: PPO | Source: Ambulatory Visit | Attending: Family Medicine | Admitting: Family Medicine

## 2020-01-19 DIAGNOSIS — Z780 Asymptomatic menopausal state: Secondary | ICD-10-CM | POA: Diagnosis not present

## 2020-01-19 DIAGNOSIS — M8589 Other specified disorders of bone density and structure, multiple sites: Secondary | ICD-10-CM | POA: Diagnosis not present

## 2020-01-24 LAB — FECAL OCCULT BLOOD, IMMUNOCHEMICAL

## 2020-01-26 ENCOUNTER — Telehealth: Payer: Self-pay

## 2020-01-26 NOTE — Telephone Encounter (Signed)
Called pt and left message on voicemail stating for her to call back with date that she collected her stool and what date did she drop it off at [facility]

## 2020-01-28 NOTE — Telephone Encounter (Signed)
Pt will pick up FIT on Monday

## 2020-01-28 NOTE — Telephone Encounter (Signed)
Patient stated she is returning a call to [place].  Please call patient back to discuss.

## 2020-02-02 ENCOUNTER — Other Ambulatory Visit: Payer: PPO

## 2020-02-02 DIAGNOSIS — Z1211 Encounter for screening for malignant neoplasm of colon: Secondary | ICD-10-CM

## 2020-02-03 DIAGNOSIS — Z1211 Encounter for screening for malignant neoplasm of colon: Secondary | ICD-10-CM | POA: Diagnosis not present

## 2020-02-05 LAB — FECAL OCCULT BLOOD, IMMUNOCHEMICAL: Fecal Occult Bld: NEGATIVE

## 2020-04-26 ENCOUNTER — Ambulatory Visit: Payer: PPO

## 2020-04-28 ENCOUNTER — Ambulatory Visit (INDEPENDENT_AMBULATORY_CARE_PROVIDER_SITE_OTHER): Payer: PPO

## 2020-04-28 DIAGNOSIS — Z0000 Encounter for general adult medical examination without abnormal findings: Secondary | ICD-10-CM | POA: Diagnosis not present

## 2020-04-28 NOTE — Progress Notes (Signed)
Subjective:   Heidi Bauer is a 74 y.o. female who presents for Medicare Annual (Subsequent) preventive examination.  Virtual Visit via Telephone Note  I connected with  Heidi Bauer on 04/28/20 at  1:20 PM EDT by telephone and verified that I am speaking with the correct person using two identifiers.  Medicare Annual Wellness visit completed telephonically due to Covid-19 pandemic.   Location: Patient: home Provider: [facility]   I discussed the limitations, risks, security and privacy concerns of performing an evaluation and management service by telephone and the availability of in person appointments. The patient expressed understanding and agreed to proceed.  Unable to perform video visit due to video visit attempted and failed and/or patient does not have video capability.   Some vital signs may be absent or patient reported.   Reather Littler, LPN    Review of Systems     Cardiac Risk Factors include: advanced age (>53men, >14 women)     Objective:    There were no vitals filed for this visit. There is no height or weight on file to calculate BMI.  Advanced Directives 04/28/2020 04/24/2018 04/23/2017 07/21/2015 04/09/2015  Does Patient Have a Medical Advance Directive? Yes Yes Yes Yes Yes  Type of Estate agent of [place];Living will Healthcare Power of [place];Living will Healthcare Power of [place];Living will Healthcare Power of [place];Living will Living will  Copy of Healthcare Power of Attorney in Chart? No - copy requested No - copy requested No - copy requested No - copy requested No - copy requested    Current Medications (verified) Outpatient Encounter Medications as of 04/28/2020  Medication Sig  . Calcium Carbonate-Vitamin D (CALCIUM 500 + D) 500-125 MG-UNIT TABS Take by mouth.  . Naproxen Sodium (ALEVE) 220 MG CAPS Take by mouth. As needed   No facility-administered encounter medications on file as of 04/28/2020.    Allergies  (verified) Sulfa antibiotics   History: Past Medical History:  Diagnosis Date  . Interstitial cystitis    Past Surgical History:  Procedure Laterality Date  . BREAST LUMPECTOMY    . KIDNEY SURGERY    . VAGINAL HYSTERECTOMY     Family History  Problem Relation Age of Onset  . Hypertension Mother    Social History   Socioeconomic History  . Marital status: Married    Spouse name: Not on file  . Number of children: 2  . Years of education: Not on file  . Highest education level: Associate degree: academic program  Occupational History  . Occupation: Retired  Tobacco Use  . Smoking status: Former Smoker    Packs/day: 0.25    Years: 15.00    Pack years: 3.75    Types: Cigarettes    Quit date: 2004    Years since quitting: 17.6  . Smokeless tobacco: Never Used  . Tobacco comment: smoking cessation materials not required  Vaping Use  . Vaping Use: Never used  Substance and Sexual Activity  . Alcohol use: Yes    Alcohol/week: 1.0 standard drink    Types: 1 Glasses of wine per week    Comment: occasionally  . Drug use: No  . Sexual activity: Yes  Other Topics Concern  . Not on file  Social History Narrative  . Not on file   Social Determinants of Health   Financial Resource Strain: Low Risk   . Difficulty of Paying Living Expenses: Not hard at all  Food Insecurity: No Food Insecurity  . Worried About Running  Out of Food in the Last Year: Never true  . Ran Out of Food in the Last Year: Never true  Transportation Needs: No Transportation Needs  . Lack of Transportation (Medical): No  . Lack of Transportation (Non-Medical): No  Physical Activity: Insufficiently Active  . Days of Exercise per Week: 2 days  . Minutes of Exercise per Session: 60 min  Stress: No Stress Concern Present  . Feeling of Stress : Not at all  Social Connections: Unknown  . Frequency of Communication with Friends and Family: Patient refused  . Frequency of Social Gatherings with Friends  and Family: Patient refused  . Attends Religious Services: Patient refused  . Active Member of Clubs or Organizations: Patient refused  . Attends Banker Meetings: Patient refused  . Marital Status: Married    Tobacco Counseling Counseling given: Not Answered Comment: smoking cessation materials not required   Clinical Intake:  Pre-visit preparation completed: Yes  Pain : No/denies pain     Nutritional Risks: None Diabetes: No  How often do you need to have someone help you when you read instructions, pamphlets, or other written materials from your doctor or pharmacy?: 1 - Never    Interpreter Needed?: No  Information entered by :: Reather Littler LPN   Activities of Daily Living In your present state of health, do you have any difficulty performing the following activities: 04/28/2020  Hearing? N  Comment declines hearing aids  Vision? N  Difficulty concentrating or making decisions? N  Walking or climbing stairs? N  Dressing or bathing? N  Doing errands, shopping? N  Preparing Food and eating ? N  Using the Toilet? N  In the past six months, have you accidently leaked urine? N  Do you have problems with loss of bowel control? N  Managing your Medications? N  Managing your Finances? N  Housekeeping or managing your Housekeeping? N  Some recent data might be hidden    Patient Care Team: Duanne Limerick, MD as PCP - General (Family Medicine)  Indicate any recent Medical Services you may have received from other than [facility] providers in the past year (date may be approximate).     Assessment:   This is a routine wellness examination for [facility].  Hearing/Vision screen  Hearing Screening   125Hz  250Hz  500Hz  1000Hz  2000Hz  3000Hz  4000Hz  6000Hz  8000Hz   Right ear:           Left ear:           Comments: Pt denies hearing difficulty   Vision Screening Comments: Annual vision screenings at [facility] [address]  Dietary issues and exercise  activities discussed: Current Exercise Habits: Home exercise routine, Type of exercise: Other - see comments (biking), Time (Minutes): 60, Frequency (Times/Week): 2, Weekly Exercise (Minutes/Week): 120, Intensity: Moderate, Exercise limited by: None identified  Goals    . DIET - INCREASE WATER INTAKE     Recommend to drink at least 6-8 8oz glasses of water per day.      Depression Screen PHQ 2/9 Scores 04/28/2020 01/06/2020 05/08/2019 04/24/2018 04/23/2017 07/21/2015 07/21/2015  PHQ - 2 Score 0 0 0 0 0 0 0  PHQ- 9 Score - 0 0 0 - - -    Fall Risk Fall Risk  04/28/2020 01/06/2020 04/24/2018 04/23/2017 07/21/2015  Falls in the past year? 0 0 No No No  Number falls in past yr: 0 - - - -  Injury with Fall? 0 - - - -  Risk  for fall due to : No Fall Risks - Impaired vision - -  Risk for fall due to: Comment - - wears eyeglasses - -  Follow up Falls prevention discussed Falls evaluation completed - - -    Any stairs in or around the home? Yes  If so, are there any without handrails? No  Home free of loose throw rugs in walkways, pet beds, electrical cords, etc? Yes  Adequate lighting in your home to reduce risk of falls? Yes   ASSISTIVE DEVICES UTILIZED TO PREVENT FALLS:  Life alert? No  Use of a cane, walker or w/c? No  Grab bars in the bathroom? No  Shower chair or bench in shower? No  Elevated toilet seat or a handicapped toilet? No   TIMED UP AND GO:  Was the test performed? No . Telephonic visit.   Cognitive Function: 6CIT deferred for 2021 AWV; pt states no memory issues     6CIT Screen 04/24/2018 04/23/2017  What Year? 0 points 0 points  What month? 0 points 0 points  What time? 0 points 0 points  Count back from 20 0 points 0 points  Months in reverse 0 points 0 points  Repeat phrase 0 points 0 points  Total Score 0 0    Immunizations Immunization History  Administered Date(s) Administered  . PFIZER SARS-COV-2 Vaccination 11/28/2019, 12/19/2019    TDAP status: Due,  Education has been provided regarding the importance of this vaccine. Advised may receive this vaccine at local pharmacy or Health Dept. Aware to provide a copy of the vaccination record if obtained from local pharmacy or Health Dept. Verbalized acceptance and understanding.   Flu Vaccine status: Declined, Education has been provided regarding the importance of this vaccine but patient still declined. Advised may receive this vaccine at local pharmacy or Health Dept. Aware to provide a copy of the vaccination record if obtained from local pharmacy or Health Dept. Verbalized acceptance and understanding.   Pneumococcal vaccine status: Declined,  Education has been provided regarding the importance of this vaccine but patient still declined. Advised may receive this vaccine at local pharmacy or Health Dept. Aware to provide a copy of the vaccination record if obtained from local pharmacy or Health Dept. Verbalized acceptance and understanding.    Covid-19 vaccine status: Completed vaccines  Qualifies for Shingles Vaccine? Yes   Zostavax completed No   Shingrix Completed?: No.    Education has been provided regarding the importance of this vaccine. Patient has been advised to call insurance company to determine out of pocket expense if they have not yet received this vaccine. Advised may also receive vaccine at local pharmacy or Health Dept. Verbalized acceptance and understanding.  Screening Tests Health Maintenance  Topic Date Due  . INFLUENZA VACCINE  04/11/2020  . TETANUS/TDAP  05/07/2020 (Originally 05/06/1965)  . Hepatitis C Screening  05/07/2020 (Originally Apr 14, 1946)  . PNA vac Low Risk Adult (1 of 2 - PCV13) 05/07/2020 (Originally 05/06/2012)  . MAMMOGRAM  01/05/2021 (Originally 05/06/1997)  . COLONOSCOPY  01/05/2021 (Originally 05/06/1997)  . COLON CANCER SCREENING ANNUAL FOBT  02/02/2021  . DEXA SCAN  Completed  . COVID-19 Vaccine  Completed    Health Maintenance  Health Maintenance  Due  Topic Date Due  . INFLUENZA VACCINE  04/11/2020   Colorectal cancer screening status: pt declines; completes annual FOBT screening   Mammogram status: pt declines  Bone Density status: Completed 01/19/20. Results reflect: Bone density results: OSTEOPENIA. Repeat every 2 years.  Lung Cancer  Screening: (Low Dose CT Chest recommended if Age 58-80 years, 30 pack-year currently smoking OR have quit w/in 15years.) does not qualify.    Additional Screening:  Hepatitis C Screening: does qualify; postponed  Vision Screening: Recommended annual ophthalmology exams for early detection of glaucoma and other disorders of the eye. Is the patient up to date with their annual eye exam?  Yes  Who is the provider or what is the name of the office in which the patient attends annual eye exams? Dr. Dorcas Mcmurray  Dental Screening: Recommended annual dental exams for proper oral hygiene  Community Resource Referral / Chronic Care Management: CRR required this visit?  No   CCM required this visit?  No      Plan:     I have personally reviewed and noted the following in the patient's chart:   . Medical and social history . Use of alcohol, tobacco or illicit drugs  . Current medications and supplements . Functional ability and status . Nutritional status . Physical activity . Advanced directives . List of other physicians . Hospitalizations, surgeries, and ER visits in previous 12 months . Vitals . Screenings to include cognitive, depression, and falls . Referrals and appointments  In addition, I have reviewed and discussed with patient certain preventive protocols, quality metrics, and best practice recommendations. A written personalized care plan for preventive services as well as general preventive health recommendations were provided to patient.     Reather Littler, LPN   05/28/6059   Nurse Notes: none

## 2020-04-28 NOTE — Patient Instructions (Signed)
Heidi Bauer , Thank you for taking time to come for your Medicare Wellness Visit. I appreciate your ongoing commitment to your health goals. Please review the following plan we discussed and let me know if I can assist you in the future.   Screening recommendations/referrals: Colonoscopy: declined Mammogram: declined Bone Density: done 01/19/20 Recommended yearly ophthalmology/optometry visit for glaucoma screening and checkup Recommended yearly dental visit for hygiene and checkup  Vaccinations: Influenza vaccine: declined Pneumococcal vaccine: declined Tdap vaccine: due Shingles vaccine: Shingrix discussed. Please contact your pharmacy for coverage information.  Covid-19: done 11/28/19 & 12/19/19  Advanced directives: Please bring a copy of your health care power of attorney and living will to the office at your convenience.  Conditions/risks identified: Keep up the great work!  Next appointment: Follow up in one year for your annual wellness visit    Preventive Care 65 Years and Older, Female Preventive care refers to lifestyle choices and visits with your health care provider that can promote health and wellness. What does preventive care include?  A yearly physical exam. This is also called an annual well check.  Dental exams once or twice a year.  Routine eye exams. Ask your health care provider how often you should have your eyes checked.  Personal lifestyle choices, including:  Daily care of your teeth and gums.  Regular physical activity.  Eating a healthy diet.  Avoiding tobacco and drug use.  Limiting alcohol use.  Practicing safe sex.  Taking low-dose aspirin every day.  Taking vitamin and mineral supplements as recommended by your health care provider. What happens during an annual well check? The services and screenings done by your health care provider during your annual well check will depend on your age, overall health, lifestyle risk factors, and family  history of disease. Counseling  Your health care provider may ask you questions about your:  Alcohol use.  Tobacco use.  Drug use.  Emotional well-being.  Home and relationship well-being.  Sexual activity.  Eating habits.  History of falls.  Memory and ability to understand (cognition).  Work and work Astronomer.  Reproductive health. Screening  You may have the following tests or measurements:  Height, weight, and BMI.  Blood pressure.  Lipid and cholesterol levels. These may be checked every 5 years, or more frequently if you are over 20 years old.  Skin check.  Lung cancer screening. You may have this screening every year starting at age 53 if you have a 30-pack-year history of smoking and currently smoke or have quit within the past 15 years.  Fecal occult blood test (FOBT) of the stool. You may have this test every year starting at age 82.  Flexible sigmoidoscopy or colonoscopy. You may have a sigmoidoscopy every 5 years or a colonoscopy every 10 years starting at age 48.  Hepatitis C blood test.  Hepatitis B blood test.  Sexually transmitted disease (STD) testing.  Diabetes screening. This is done by checking your blood sugar (glucose) after you have not eaten for a while (fasting). You may have this done every 1-3 years.  Bone density scan. This is done to screen for osteoporosis. You may have this done starting at age 5.  Mammogram. This may be done every 1-2 years. Talk to your health care provider about how often you should have regular mammograms. Talk with your health care provider about your test results, treatment options, and if necessary, the need for more tests. Vaccines  Your health care provider may recommend certain  vaccines, such as:  Influenza vaccine. This is recommended every year.  Tetanus, diphtheria, and acellular pertussis (Tdap, Td) vaccine. You may need a Td booster every 10 years.  Zoster vaccine. You may need this after  age 24.  Pneumococcal 13-valent conjugate (PCV13) vaccine. One dose is recommended after age 66.  Pneumococcal polysaccharide (PPSV23) vaccine. One dose is recommended after age 48. Talk to your health care provider about which screenings and vaccines you need and how often you need them. This information is not intended to replace advice given to you by your health care provider. Make sure you discuss any questions you have with your health care provider. Document Released: 09/24/2015 Document Revised: 05/17/2016 Document Reviewed: 06/29/2015 Elsevier Interactive Patient Education  2017 [place] Prevention in the Home Falls can cause injuries. They can happen to people of all ages. There are many things you can do to make your home safe and to help prevent falls. What can I do on the outside of my home?  Regularly fix the edges of walkways and driveways and fix any cracks.  Remove anything that might make you trip as you walk through a door, such as a raised step or threshold.  Trim any bushes or trees on the path to your home.  Use bright outdoor lighting.  Clear any walking paths of anything that might make someone trip, such as rocks or tools.  Regularly check to see if handrails are loose or broken. Make sure that both sides of any steps have handrails.  Any raised decks and porches should have guardrails on the edges.  Have any leaves, snow, or ice cleared regularly.  Use sand or salt on walking paths during winter.  Clean up any spills in your garage right away. This includes oil or grease spills. What can I do in the bathroom?  Use night lights.  Install grab bars by the toilet and in the tub and shower. Do not use towel bars as grab bars.  Use non-skid mats or decals in the tub or shower.  If you need to sit down in the shower, use a plastic, non-slip stool.  Keep the floor dry. Clean up any water that spills on the floor as soon as it  happens.  Remove soap buildup in the tub or shower regularly.  Attach bath mats securely with double-sided non-slip rug tape.  Do not have throw rugs and other things on the floor that can make you trip. What can I do in the bedroom?  Use night lights.  Make sure that you have a light by your bed that is easy to reach.  Do not use any sheets or blankets that are too big for your bed. They should not hang down onto the floor.  Have a firm chair that has side arms. You can use this for support while you get dressed.  Do not have throw rugs and other things on the floor that can make you trip. What can I do in the kitchen?  Clean up any spills right away.  Avoid walking on wet floors.  Keep items that you use a lot in easy-to-reach places.  If you need to reach something above you, use a strong step stool that has a grab bar.  Keep electrical cords out of the way.  Do not use floor polish or wax that makes floors slippery. If you must use wax, use non-skid floor wax.  Do not have throw rugs and other things  on the floor that can make you trip. What can I do with my stairs?  Do not leave any items on the stairs.  Make sure that there are handrails on both sides of the stairs and use them. Fix handrails that are broken or loose. Make sure that handrails are as long as the stairways.  Check any carpeting to make sure that it is firmly attached to the stairs. Fix any carpet that is loose or worn.  Avoid having throw rugs at the top or bottom of the stairs. If you do have throw rugs, attach them to the floor with carpet tape.  Make sure that you have a light switch at the top of the stairs and the bottom of the stairs. If you do not have them, ask someone to add them for you. What else can I do to help prevent falls?  Wear shoes that:  Do not have high heels.  Have rubber bottoms.  Are comfortable and fit you well.  Are closed at the toe. Do not wear sandals.  If you  use a stepladder:  Make sure that it is fully opened. Do not climb a closed stepladder.  Make sure that both sides of the stepladder are locked into place.  Ask someone to hold it for you, if possible.  Clearly mark and make sure that you can see:  Any grab bars or handrails.  First and last steps.  Where the edge of each step is.  Use tools that help you move around (mobility aids) if they are needed. These include:  Canes.  Walkers.  Scooters.  Crutches.  Turn on the lights when you go into a dark area. Replace any light bulbs as soon as they burn out.  Set up your furniture so you have a clear path. Avoid moving your furniture around.  If any of your floors are uneven, fix them.  If there are any pets around you, be aware of where they are.  Review your medicines with your doctor. Some medicines can make you feel dizzy. This can increase your chance of falling. Ask your doctor what other things that you can do to help prevent falls. This information is not intended to replace advice given to you by your health care provider. Make sure you discuss any questions you have with your health care provider. Document Released: 06/24/2009 Document Revised: 02/03/2016 Document Reviewed: 10/02/2014 Elsevier Interactive Patient Education  2017 [employer].

## 2020-08-30 ENCOUNTER — Telehealth: Payer: Self-pay

## 2020-08-30 NOTE — Telephone Encounter (Signed)
Please schedule patient for health care maintenance visit after 01/05/2021.   Thank you.

## 2020-09-06 ENCOUNTER — Other Ambulatory Visit: Payer: Self-pay

## 2020-09-06 ENCOUNTER — Encounter: Payer: Self-pay | Admitting: Family Medicine

## 2020-09-06 ENCOUNTER — Ambulatory Visit (INDEPENDENT_AMBULATORY_CARE_PROVIDER_SITE_OTHER): Payer: PPO | Admitting: Family Medicine

## 2020-09-06 VITALS — BP 148/90 | HR 90 | Ht 66.0 in | Wt 159.0 lb

## 2020-09-06 DIAGNOSIS — Z780 Asymptomatic menopausal state: Secondary | ICD-10-CM | POA: Diagnosis not present

## 2020-09-06 DIAGNOSIS — R030 Elevated blood-pressure reading, without diagnosis of hypertension: Secondary | ICD-10-CM

## 2020-09-06 DIAGNOSIS — Z0000 Encounter for general adult medical examination without abnormal findings: Secondary | ICD-10-CM

## 2020-09-06 NOTE — Patient Instructions (Signed)
DASH Eating Plan DASH stands for "Dietary Approaches to Stop Hypertension." The DASH eating plan is a healthy eating plan that has been shown to reduce high blood pressure (hypertension). It may also reduce your risk for type 2 diabetes, heart disease, and stroke. The DASH eating plan may also help with weight loss. What are tips for following this plan?  General guidelines  Avoid eating more than 2,300 mg (milligrams) of salt (sodium) a day. If you have hypertension, you may need to reduce your sodium intake to 1,500 mg a day.  Limit alcohol intake to no more than 1 drink a day for nonpregnant women and 2 drinks a day for men. One drink equals 12 oz of beer, 5 oz of wine, or 1 oz of hard liquor.  Work with your health care provider to maintain a healthy body weight or to lose weight. Ask what an ideal weight is for you.  Get at least 30 minutes of exercise that causes your heart to beat faster (aerobic exercise) most days of the week. Activities may include walking, swimming, or biking.  Work with your health care provider or diet and nutrition specialist (dietitian) to adjust your eating plan to your individual calorie needs. Reading food labels   Check food labels for the amount of sodium per serving. Choose foods with less than 5 percent of the Daily Value of sodium. Generally, foods with less than 300 mg of sodium per serving fit into this eating plan.  To find whole grains, look for the word "whole" as the first word in the ingredient list. Shopping  Buy products labeled as "low-sodium" or "no salt added."  Buy fresh foods. Avoid canned foods and premade or frozen meals. Cooking  Avoid adding salt when cooking. Use salt-free seasonings or herbs instead of table salt or sea salt. Check with your health care provider or pharmacist before using salt substitutes.  Do not fry foods. Cook foods using healthy methods such as baking, boiling, grilling, and broiling instead.  Cook with  heart-healthy oils, such as olive, canola, soybean, or sunflower oil. Meal planning  Eat a balanced diet that includes: ? 5 or more servings of fruits and vegetables each day. At each meal, try to fill half of your plate with fruits and vegetables. ? Up to 6-8 servings of whole grains each day. ? Less than 6 oz of lean meat, poultry, or fish each day. A 3-oz serving of meat is about the same size as a deck of cards. One egg equals 1 oz. ? 2 servings of low-fat dairy each day. ? A serving of nuts, seeds, or beans 5 times each week. ? Heart-healthy fats. Healthy fats called Omega-3 fatty acids are found in foods such as flaxseeds and coldwater fish, like sardines, salmon, and mackerel.  Limit how much you eat of the following: ? Canned or prepackaged foods. ? Food that is high in trans fat, such as fried foods. ? Food that is high in saturated fat, such as fatty meat. ? Sweets, desserts, sugary drinks, and other foods with added sugar. ? Full-fat dairy products.  Do not salt foods before eating.  Try to eat at least 2 vegetarian meals each week.  Eat more home-cooked food and less restaurant, buffet, and fast food.  When eating at a restaurant, ask that your food be prepared with less salt or no salt, if possible. What foods are recommended? The items listed may not be a complete list. Talk with your dietitian about   what dietary choices are best for you. Grains Whole-grain or whole-wheat bread. Whole-grain or whole-wheat pasta. Brown rice. Oatmeal. Quinoa. Bulgur. Whole-grain and low-sodium cereals. Pita bread. Low-fat, low-sodium crackers. Whole-wheat flour tortillas. Vegetables Fresh or frozen vegetables (raw, steamed, roasted, or grilled). Low-sodium or reduced-sodium tomato and vegetable juice. Low-sodium or reduced-sodium tomato sauce and tomato paste. Low-sodium or reduced-sodium canned vegetables. Fruits All fresh, dried, or frozen fruit. Canned fruit in natural juice (without  added sugar). Meat and other protein foods Skinless chicken or turkey. Ground chicken or turkey. Pork with fat trimmed off. Fish and seafood. Egg whites. Dried beans, peas, or lentils. Unsalted nuts, nut butters, and seeds. Unsalted canned beans. Lean cuts of beef with fat trimmed off. Low-sodium, lean deli meat. Dairy Low-fat (1%) or fat-free (skim) milk. Fat-free, low-fat, or reduced-fat cheeses. Nonfat, low-sodium ricotta or cottage cheese. Low-fat or nonfat yogurt. Low-fat, low-sodium cheese. Fats and oils Soft margarine without trans fats. Vegetable oil. Low-fat, reduced-fat, or light mayonnaise and salad dressings (reduced-sodium). Canola, safflower, olive, soybean, and sunflower oils. Avocado. Seasoning and other foods Herbs. Spices. Seasoning mixes without salt. Unsalted popcorn and pretzels. Fat-free sweets. What foods are not recommended? The items listed may not be a complete list. Talk with your dietitian about what dietary choices are best for you. Grains Baked goods made with fat, such as croissants, muffins, or some breads. Dry pasta or rice meal packs. Vegetables Creamed or fried vegetables. Vegetables in a cheese sauce. Regular canned vegetables (not low-sodium or reduced-sodium). Regular canned tomato sauce and paste (not low-sodium or reduced-sodium). Regular tomato and vegetable juice (not low-sodium or reduced-sodium). Pickles. Olives. Fruits Canned fruit in a light or heavy syrup. Fried fruit. Fruit in cream or butter sauce. Meat and other protein foods Fatty cuts of meat. Ribs. Fried meat. Bacon. Sausage. Bologna and other processed lunch meats. Salami. Fatback. Hotdogs. Bratwurst. Salted nuts and seeds. Canned beans with added salt. Canned or smoked fish. Whole eggs or egg yolks. Chicken or turkey with skin. Dairy Whole or 2% milk, cream, and half-and-half. Whole or full-fat cream cheese. Whole-fat or sweetened yogurt. Full-fat cheese. Nondairy creamers. Whipped toppings.  Processed cheese and cheese spreads. Fats and oils Butter. Stick margarine. Lard. Shortening. Ghee. Bacon fat. Tropical oils, such as coconut, palm kernel, or palm oil. Seasoning and other foods Salted popcorn and pretzels. Onion salt, garlic salt, seasoned salt, table salt, and sea salt. Worcestershire sauce. Tartar sauce. Barbecue sauce. Teriyaki sauce. Soy sauce, including reduced-sodium. Steak sauce. Canned and packaged gravies. Fish sauce. Oyster sauce. Cocktail sauce. Horseradish that you find on the shelf. Ketchup. Mustard. Meat flavorings and tenderizers. Bouillon cubes. Hot sauce and Tabasco sauce. Premade or packaged marinades. Premade or packaged taco seasonings. Relishes. Regular salad dressings. Where to find more information:  National Heart, Lung, and Blood Institute: www.nhlbi.nih.gov  American Heart Association: www.heart.org Summary  The DASH eating plan is a healthy eating plan that has been shown to reduce high blood pressure (hypertension). It may also reduce your risk for type 2 diabetes, heart disease, and stroke.  With the DASH eating plan, you should limit salt (sodium) intake to 2,300 mg a day. If you have hypertension, you may need to reduce your sodium intake to 1,500 mg a day.  When on the DASH eating plan, aim to eat more fresh fruits and vegetables, whole grains, lean proteins, low-fat dairy, and heart-healthy fats.  Work with your health care provider or diet and nutrition specialist (dietitian) to adjust your eating plan to your   individual calorie needs. This information is not intended to replace advice given to you by your health care provider. Make sure you discuss any questions you have with your health care provider. Document Revised: 08/10/2017 Document Reviewed: 08/21/2016 Elsevier Patient Education  2020 Elsevier Inc.  

## 2020-09-06 NOTE — Progress Notes (Signed)
Date:  09/06/2020   Name:  Heidi Bauer   DOB:  10-03-1945   MRN:  [id number]   Chief Complaint: Follow-up  Hypertension This is a new problem. The problem has been waxing and waning since onset. The problem is controlled. Pertinent negatives include no anxiety, blurred vision, chest pain, headaches, malaise/fatigue, neck pain, orthopnea, palpitations, peripheral edema, PND, shortness of breath or sweats. There are no associated agents to hypertension. Past treatments include nothing.    No results found for: CREATININE, BUN, NA, K, CL, CO2 No results found for: CHOL, HDL, LDLCALC, LDLDIRECT, TRIG, CHOLHDL No results found for: TSH No results found for: HGBA1C No results found for: WBC, HGB, HCT, MCV, PLT No results found for: ALT, AST, GGT, ALKPHOS, BILITOT   Review of Systems  Constitutional: Negative.  Negative for chills, fatigue, fever, malaise/fatigue and unexpected weight change.  HENT: Negative for congestion, ear discharge, ear pain, rhinorrhea, sinus pressure, sneezing and sore throat.   Eyes: Negative for blurred vision, double vision, photophobia, pain, discharge, redness and itching.  Respiratory: Negative for cough, shortness of breath, wheezing and stridor.   Cardiovascular: Negative for chest pain, palpitations, orthopnea and PND.  Gastrointestinal: Negative for abdominal pain, blood in stool, constipation, diarrhea, nausea and vomiting.  Endocrine: Negative for cold intolerance, heat intolerance, polydipsia, polyphagia and polyuria.  Genitourinary: Negative for dysuria, flank pain, frequency, hematuria, menstrual problem, pelvic pain, urgency, vaginal bleeding and vaginal discharge.  Musculoskeletal: Negative for arthralgias, back pain, myalgias and neck pain.  Skin: Negative for rash.  Allergic/Immunologic: Negative for environmental allergies and food allergies.  Neurological: Negative for dizziness, weakness, light-headedness, numbness and headaches.   Hematological: Negative for adenopathy. Does not bruise/bleed easily.  Psychiatric/Behavioral: Negative for dysphoric mood. The patient is not nervous/anxious.     There are no problems to display for this patient.   Allergies  Allergen Reactions  . Sulfa Antibiotics     Past Surgical History:  Procedure Laterality Date  . BREAST LUMPECTOMY    . KIDNEY SURGERY    . VAGINAL HYSTERECTOMY      Social History   Tobacco Use  . Smoking status: Former Smoker    Packs/day: 0.25    Years: 15.00    Pack years: 3.75    Types: Cigarettes    Quit date: 2004    Years since quitting: 18.0  . Smokeless tobacco: Never Used  . Tobacco comment: smoking cessation materials not required  Vaping Use  . Vaping Use: Never used  Substance Use Topics  . Alcohol use: Yes    Alcohol/week: 1.0 standard drink    Types: 1 Glasses of wine per week    Comment: occasionally  . Drug use: No     Medication list has been reviewed and updated.  Current Meds  Medication Sig  . Calcium Carbonate-Vitamin D (CALCIUM 500 + D) 500-125 MG-UNIT TABS Take by mouth.  . Naproxen Sodium 220 MG CAPS Take by mouth. As needed    [facility] 2/9 Scores 09/06/2020 04/28/2020 01/06/2020 05/08/2019  PHQ - 2 Score 0 0 0 0  PHQ- 9 Score 0 - 0 0    GAD 7 : Generalized Anxiety Score 09/06/2020 01/06/2020  Nervous, Anxious, on Edge 0 0  Control/stop worrying 0 0  Worry too much - different things 0 0  Trouble relaxing 0 0  Restless 0 0  Easily annoyed or irritable 0 0  Afraid - awful might happen 0 0  Total GAD 7 Score 0 0  Anxiety Difficulty Not difficult at all -    BP Readings from Last 3 Encounters:  09/06/20 (!) 148/90  01/06/20 120/70  05/08/19 130/80    Physical Exam Vitals and nursing note reviewed.  Constitutional:      Appearance: She is well-developed and well-nourished.  HENT:     Head: Normocephalic.     Right Ear: Tympanic membrane, ear canal and external ear normal.     Left Ear: Tympanic  membrane, ear canal and external ear normal.     Nose: Nose normal.     Mouth/Throat:     Mouth: Oropharynx is clear and moist. Mucous membranes are moist.  Eyes:     General: Lids are everted, no foreign bodies appreciated. No scleral icterus.       Left eye: No foreign body or hordeolum.     Extraocular Movements: EOM normal.     Conjunctiva/sclera: Conjunctivae normal.     Right eye: Right conjunctiva is not injected.     Left eye: Left conjunctiva is not injected.     Pupils: Pupils are equal, round, and reactive to light.  Neck:     Thyroid: No thyromegaly.     Vascular: No JVD.     Trachea: No tracheal deviation.  Cardiovascular:     Rate and Rhythm: Normal rate and regular rhythm.     Pulses: Intact distal pulses.     Heart sounds: Normal heart sounds. No murmur heard. No friction rub. No gallop.   Pulmonary:     Effort: Pulmonary effort is normal. No respiratory distress.     Breath sounds: Normal breath sounds. No wheezing, rhonchi or rales.  Abdominal:     General: Bowel sounds are normal.     Palpations: Abdomen is soft. There is no hepatosplenomegaly or mass.     Tenderness: There is no abdominal tenderness. There is no guarding or rebound.  Musculoskeletal:        General: No tenderness or edema. Normal range of motion.     Cervical back: Normal range of motion and neck supple.  Lymphadenopathy:     Cervical: No cervical adenopathy.  Skin:    General: Skin is warm.     Findings: No rash.  Neurological:     Mental Status: She is alert and oriented to person, place, and time.     Cranial Nerves: No cranial nerve deficit.     Deep Tendon Reflexes: Strength normal. Reflexes normal.  Psychiatric:        Mood and Affect: Mood and affect normal. Mood is not anxious or depressed.     Wt Readings from Last 3 Encounters:  09/06/20 159 lb (72.1 kg)  01/06/20 165 lb (74.8 kg)  05/08/19 165 lb (74.8 kg)    BP (!) 148/90   Pulse 90   Ht 5\' 6"  (1.676 m)   Wt 159 lb  (72.1 kg)   SpO2 98%   BMI 25.66 kg/m   Assessment and Plan:  1. Health care maintenance Subjective objective concerns noted during history and physical exam.  Patient is doing well without concerns  2. Elevated blood pressure reading New onset.  Stable.  Blood pressure mildly elevated which is not trended this way but patient is recently returning from a cruise.  Patient given diet and cautioned about sodium.  3. Menopause Chronic.  Controlled.  Stable.  Doing well without supplementation.

## 2021-03-07 ENCOUNTER — Ambulatory Visit (INDEPENDENT_AMBULATORY_CARE_PROVIDER_SITE_OTHER): Payer: PPO | Admitting: Family Medicine

## 2021-03-07 ENCOUNTER — Other Ambulatory Visit: Payer: Self-pay

## 2021-03-07 ENCOUNTER — Telehealth: Payer: Self-pay

## 2021-03-07 DIAGNOSIS — U071 COVID-19: Secondary | ICD-10-CM

## 2021-03-07 DIAGNOSIS — R059 Cough, unspecified: Secondary | ICD-10-CM | POA: Diagnosis not present

## 2021-03-07 MED ORDER — MOLNUPIRAVIR EUA 200MG CAPSULE: 4.0000 | ORAL_CAPSULE | Freq: Two times a day (BID) | ORAL | 0 refills | Status: DC

## 2021-03-07 MED ORDER — GUAIFENESIN-CODEINE 100-10 MG/5ML PO SYRP: 5.0000 mL | ORAL_SOLUTION | Freq: Four times a day (QID) | ORAL | 0 refills | Status: DC | PRN

## 2021-03-07 MED ORDER — MOLNUPIRAVIR EUA 200MG CAPSULE: 4.0000 | ORAL_CAPSULE | Freq: Two times a day (BID) | ORAL | 0 refills | Status: AC

## 2021-03-07 NOTE — Telephone Encounter (Signed)
[facility] does not supply molnupiravir EUA 200 mg CAPS They stated this will need to go to a chain pharmacy to be filled/ they asked if Dr. Yetta Barre wants them to fill the cough syrup or cancel that one as well since she will have to send the RX for molnupiravir EUA 200 mg CAPS somewhere else/ please advise

## 2021-03-07 NOTE — Telephone Encounter (Unsigned)
Copied from CRM [id number]. Topic: General - Other >> Mar 07, 2021  9:46 AM Gaetana Michaelis A wrote: Reason for CRM: Patient tested positive, via an at home test, for COVID 19 yesterday 03/06/21  Patient's husband would like the patient prescribed something for discomfort   Patient has a cough as well as "non generalized body aches"  Please contact to further advise when possible

## 2021-03-07 NOTE — Progress Notes (Signed)
Date:  03/07/2021   Name:  Heidi Bauer   DOB:  07/21/1946   MRN:  [id number]   Chief Complaint: Covid Positive (Started 4 days ago with runny nose, cough, congestion but no fever. No production, can't get it up and out. Has been taking Tylenol and Mucinex. Took home test yesterday positive and then followed up with health dept today and was positive.)  I Anne Fu, MD from my office connected with this patient, Heidi Bauer home., by telephone at the patient's home.  I verified that I am speaking with the correct person using two identifiers. This visit was conducted via telephone due to the Covid-19 outbreak from my office at [facility] in [place], [place]. I discussed the limitations, risks, security and privacy concerns of performing an evaluation and management service by telephone. I also discussed with the patient that there may be a patient responsible charge related to this service. The patient expressed understanding and agreed to proceed.    Cough This is a new problem. The current episode started in the past 7 days (thursday). The problem has been waxing and waning. The cough is Non-productive. Associated symptoms include chest pain, chills, myalgias, nasal congestion, postnasal drip and rhinorrhea. Pertinent negatives include no fever, sore throat, shortness of breath or wheezing. Risk factors for lung disease include travel. She has tried OTC cough suppressant and prescription cough suppressant for the symptoms. The treatment provided mild relief. There is no history of COPD or emphysema.   No results found for: CREATININE, BUN, NA, K, CL, CO2 No results found for: CHOL, HDL, LDLCALC, LDLDIRECT, TRIG, CHOLHDL No results found for: TSH No results found for: HGBA1C No results found for: WBC, HGB, HCT, MCV, PLT No results found for: ALT, AST, GGT, ALKPHOS, BILITOT   Review of Systems  Constitutional:  Positive for chills. Negative for fever.  HENT:  Positive for postnasal  drip and rhinorrhea. Negative for sore throat.   Respiratory:  Positive for cough. Negative for shortness of breath and wheezing.   Cardiovascular:  Positive for chest pain.  Musculoskeletal:  Positive for myalgias.   There are no problems to display for this patient.   Allergies  Allergen Reactions   Sulfa Antibiotics     Past Surgical History:  Procedure Laterality Date   BREAST LUMPECTOMY     KIDNEY SURGERY     VAGINAL HYSTERECTOMY      Social History   Tobacco Use   Smoking status: Former    Packs/day: 0.25    Years: 15.00    Pack years: 3.75    Types: Cigarettes    Quit date: 2004    Years since quitting: 18.4   Smokeless tobacco: Never   Tobacco comments:    smoking cessation materials not required  Vaping Use   Vaping Use: Never used  Substance Use Topics   Alcohol use: Yes    Alcohol/week: 1.0 standard drink    Types: 1 Glasses of wine per week    Comment: occasionally   Drug use: No     Medication list has been reviewed and updated.  Current Meds  Medication Sig   Calcium Carbonate-Vitamin D (CALCIUM 500 + D) 500-125 MG-UNIT TABS Take by mouth.   Naproxen Sodium 220 MG CAPS Take by mouth. As needed    PHQ 2/9 Scores 09/06/2020 04/28/2020 01/06/2020 05/08/2019  PHQ - 2 Score 0 0 0 0  PHQ- 9 Score 0 - 0 0    GAD 7 :  Generalized Anxiety Score 09/06/2020 01/06/2020  Nervous, Anxious, on Edge 0 0  Control/stop worrying 0 0  Worry too much - different things 0 0  Trouble relaxing 0 0  Restless 0 0  Easily annoyed or irritable 0 0  Afraid - awful might happen 0 0  Total GAD 7 Score 0 0  Anxiety Difficulty Not difficult at all -    BP Readings from Last 3 Encounters:  09/06/20 (!) 148/90  01/06/20 120/70  05/08/19 130/80    Physical Exam Vitals (pulse ox 98%) and nursing note reviewed.  Neurological:     Mental Status: She is alert.    Wt Readings from Last 3 Encounters:  09/06/20 159 lb (72.1 kg)  01/06/20 165 lb (74.8 kg)  05/08/19  165 lb (74.8 kg)    There were no vitals taken for this visit.  Assessment and Plan:  1. COVID-19 Patient began having symptoms on Thursday with a positive COVID test over the weekend and verified by health department this morning.  Patient calls no longer having fever or chills but continues to have a cough.  Previously had myalgias which she has none now and there is some chest tenderness but presumably from the excessive coughing.  Given the persistence of cough and the discomfort as well as patient's age we will treat with antiviral molnupiravir 400 mg twice a day for 5 days.  Information has been discussed about symptoms that would necessitate a hospital visit including consistence of discomfort in the chest as well as shortness of breath. - molnupiravir EUA 200 mg CAPS; Take 4 capsules (800 mg total) by mouth 2 (two) times daily for 5 days.  Dispense: 40 capsule; Refill: 0  2. Cough Patient has persistence of cough which she needs further intervention we will call in Robitussin-AC 1 teaspoon every 6 hours as needed for cough. - guaiFENesin-codeine (ROBITUSSIN AC) 100-10 MG/5ML syrup; Take 5 mLs by mouth 4 (four) times daily as needed for cough.  Dispense: 118 mL; Refill: 0   I spent 10 minutes with this patient, More than 50% of that time was spent in face to face education, counseling and care coordination.

## 2021-03-07 NOTE — Patient Instructions (Signed)
COVID-19: What to Do if You Are Sick CDC has updated isolation and quarantine recommendations for the public, and is revising the CDC website to reflect these changes. These recommendations do not apply to healthcare personnel and do not supersede state, local, tribal, or territorial laws, rules, andregulations. If you have a fever, cough or other symptoms, you might have COVID-19. Most people have mild illness and are able to recover at home. If you are sick: Keep track of your symptoms. If you have an emergency warning sign (including trouble breathing), call 911. Steps to help prevent the spread of COVID-19 if you are sick If you are sick with COVID-19 or think you might have COVID-19, follow the steps below to care for yourself and to help protect other peoplein your home and community. Stay home except to get medical care Stay home. Most people with COVID-19 have mild illness and can recover at home without medical care. Do not leave your home, except to get medical care. Do not visit public areas. Take care of yourself. Get rest and stay hydrated. Take over-the-counter medicines, such as acetaminophen, to help you feel better. Stay in touch with your doctor. Call before you get medical care. Be sure to get care if you have trouble breathing, or have any other emergency warning signs, or if you think it is an emergency. Avoid public transportation, ride-sharing, or taxis. Separate yourself from other people As much as possible, stay in a specific room and away from other people and pets in your home. If possible, you should use a separate bathroom. If you need to be around other people or animals in oroutside of the home, wear a mask. Tell your close contactsthat they may have been exposed to COVID-19. An infected person can spread COVID-19 starting 48 hours (or 2 days) before the person has any symptoms or tests positive. By letting your close contacts know they may have been exposed to COVID-19,  you are helping to protect everyone. Additional guidance is available for those living in close quarters and shared housing. See COVID-19 and Animals if you have questions about pets. If you are diagnosed with COVID-19, someone from the health department may call you. Answer the call to slow the spread. Monitor your symptoms Symptoms of COVID-19 include fever, cough, or other symptoms. Follow care instructions from your healthcare provider and local health department. Your local health authorities may give instructions on checking your symptoms and reporting information. When to seek emergency medical attention Look for emergency warning signs* for COVID-19. If someone is showing any of these signs, seek emergency medical care immediately: Trouble breathing Persistent pain or pressure in the chest New confusion Inability to wake or stay awake Pale, gray, or blue-colored skin, lips, or nail beds, depending on skin tone *This list is not all possible symptoms. Please call your medical provider forany other symptoms that are severe or concerning to you. Call 911 or call ahead to your local emergency facility: Notify the operator that you are seeking care for someone who has or may haveCOVID-19. Call ahead before visiting your doctor Call ahead. Many medical visits for routine care are being postponed or done by phone or telemedicine. If you have a medical appointment that cannot be postponed, call your doctor's office, and tell them you have or may have COVID-19. This will help the office protect themselves and other patients. Get tested If you have symptoms of COVID-19, get tested. While waiting for test results, you stay away from others,   including staying apart from those living in your household. Self-tests are one of several options for testing for the virus that causes COVID-19 and may be more convenient than laboratory-based tests and point-of-care tests. Ask your healthcare provider or  your local health department if you need help interpreting your test results. You can visit your state, tribal, local, and territorial health department's website to look for the latest local information on testing sites. If you are sick, wear a mask over your nose and mouth You should wear a mask over your nose and mouth if you must be around other people or animals, including pets (even at home). You don't need to wear the mask if you are alone. If you can't put on a mask (because of trouble breathing, for example), cover your coughs and sneezes in some other way. Try to stay at least 6 feet away from other people. This will help protect the people around you. Masks should not be placed on young children under age 2 years, anyone who has trouble breathing, or anyone who is not able to remove the mask without help. Note: During the COVID-19 pandemic, medical grade facemasks are reserved forhealthcare workers and some first responders. Cover your coughs and sneezes Cover your mouth and nose with a tissue when you cough or sneeze. Throw away used tissues in a lined trash can. Immediately wash your hands with soap and water for at least 20 seconds. If soap and water are not available, clean your hands with an alcohol-based hand sanitizer that contains at least 60% alcohol. Clean your hands often Wash your hands often with soap and water for at least 20 seconds. This is especially important after blowing your nose, coughing, or sneezing; going to the bathroom; and before eating or preparing food. Use hand sanitizer if soap and water are not available. Use an alcohol-based hand sanitizer with at least 60% alcohol, covering all surfaces of your hands and rubbing them together until they feel dry. Soap and water are the best option, especially if hands are visibly dirty. Avoid touching your eyes, nose, and mouth with unwashed hands. Handwashing Tips Avoid sharing personal household items Do not share  dishes, drinking glasses, cups, eating utensils, towels, or bedding with other people in your home. Wash these items thoroughly after using them with soap and water or put in the dishwasher. Clean all "high-touch" surfaces every day Clean and disinfect high-touch surfaces in your "sick room" and bathroom; wear disposable gloves. Let someone else clean and disinfect surfaces in common areas, but you should clean your bedroom and bathroom, if possible. If a caregiver or other person needs to clean and disinfect a sick person's bedroom or bathroom, they should do so on an as-needed basis. The caregiver/other person should wear a mask and disposable gloves prior to cleaning. They should wait as long as possible after the person who is sick has used the bathroom before coming in to clean and use the bathroom. High-touch surfaces include phones, remote controls, counters, tabletops, doorknobs, bathroom fixtures, toilets, keyboards, tablets, and bedside tables. Clean and disinfect areas that may have blood, stool, or body fluids on them. Use household cleaners and disinfectants. Clean the area or item with soap and water or another detergent if it is dirty. Then, use a household disinfectant. Be sure to follow the instructions on the label to ensure safe and effective use of the product. Many products recommend keeping the surface wet for several minutes to ensure germs are killed. Many   also recommend precautions such as wearing gloves and making sure you have good ventilation during use of the product. Use a product from EPA's List N: Disinfectants for Coronavirus (COVID-19). Complete Disinfection Guidance When you can be around others after being sick with COVID-19 Deciding when you can be around others is different for different situations. Find out when you can safely end home isolation. For any additional questions about your care,contact your healthcare provider or state or local health  department. 08/18/2020 Content source: National Center for Immunization and Respiratory Diseases (NCIRD), Division of Viral Diseases This information is not intended to replace advice given to you by your health care provider. Make sure you discuss any questions you have with your healthcare provider. Document Revised: 10/15/2020 Document Reviewed: 10/15/2020 Elsevier Patient Education  2022 Elsevier Inc.  

## 2021-03-09 ENCOUNTER — Ambulatory Visit: Payer: PPO | Admitting: Family Medicine

## 2021-05-02 ENCOUNTER — Ambulatory Visit: Payer: PPO

## 2021-05-25 ENCOUNTER — Ambulatory Visit: Payer: PPO

## 2021-05-26 ENCOUNTER — Telehealth: Payer: Self-pay | Admitting: Family Medicine

## 2021-05-26 NOTE — Telephone Encounter (Signed)
Copied from CRM [id number]. Topic: Medicare AWV >> May 26, 2021 10:13 AM Claudette Laws R wrote: Reason for CRM: Left message for patient to call back and schedule Medicare Annual Wellness Visit (AWV) in office.   If unable to come into the office for AWV,  please offer to do virtually or by telephone.  Last AWV:  04/28/2020  Please schedule at anytime with [facility] Health Advisor.  40 minute appointment  Any questions, please contact me at [phone]

## 2021-05-30 ENCOUNTER — Ambulatory Visit
Admission: RE | Admit: 2021-05-30 | Discharge: 2021-05-30 | Disposition: A | Discharge status: Home / Self Care | Payer: PPO | Attending: Family Medicine | Admitting: Family Medicine

## 2021-05-30 ENCOUNTER — Other Ambulatory Visit: Payer: Self-pay

## 2021-05-30 ENCOUNTER — Encounter: Payer: Self-pay | Admitting: Family Medicine

## 2021-05-30 ENCOUNTER — Ambulatory Visit
Admission: RE | Admit: 2021-05-30 | Discharge: 2021-05-30 | Disposition: A | Discharge status: Home / Self Care | Payer: PPO | Source: Ambulatory Visit | Attending: Family Medicine | Admitting: Family Medicine

## 2021-05-30 ENCOUNTER — Ambulatory Visit (INDEPENDENT_AMBULATORY_CARE_PROVIDER_SITE_OTHER): Payer: PPO | Admitting: Family Medicine

## 2021-05-30 VITALS — BP 136/78 | HR 76 | Ht 66.0 in | Wt 155.0 lb

## 2021-05-30 DIAGNOSIS — Z0000 Encounter for general adult medical examination without abnormal findings: Secondary | ICD-10-CM | POA: Diagnosis not present

## 2021-05-30 DIAGNOSIS — M25561 Pain in right knee: Secondary | ICD-10-CM

## 2021-05-30 DIAGNOSIS — G8929 Other chronic pain: Secondary | ICD-10-CM

## 2021-05-30 DIAGNOSIS — Z1211 Encounter for screening for malignant neoplasm of colon: Secondary | ICD-10-CM

## 2021-05-30 MED ORDER — MELOXICAM 7.5 MG PO TABS: 7.5000 mg | ORAL_TABLET | Freq: Every day | ORAL | 0 refills | Status: DC

## 2021-05-30 NOTE — Progress Notes (Signed)
Date:  05/30/2021   Name:  Heidi Bauer   DOB:  [birth year]   MRN:  [id number]   Chief Complaint: Knee Pain (R) knee pain and swelling- arthritis? No trauma that she is aware of)  Knee Pain  The incident occurred more than 1 week ago. There was no injury mechanism. The pain is present in the right knee. The quality of the pain is described as aching (sometimes a stabing pain s/p walking). The pain is at a severity of 7/10. The pain is moderate. The pain has been Intermittent since onset. Pertinent negatives include no inability to bear weight, loss of motion, loss of sensation, muscle weakness, numbness or tingling. She reports no foreign bodies present. Nothing aggravates the symptoms. She has tried NSAIDs (voltaren) for the symptoms. The treatment provided mild relief.   No results found for: CREATININE, BUN, NA, K, CL, CO2 No results found for: CHOL, HDL, LDLCALC, LDLDIRECT, TRIG, CHOLHDL No results found for: TSH No results found for: HGBA1C No results found for: WBC, HGB, HCT, MCV, PLT No results found for: ALT, AST, GGT, ALKPHOS, BILITOT   Review of Systems  Constitutional:  Negative for chills and fever.  HENT:  Negative for drooling, ear discharge, ear pain and sore throat.   Respiratory:  Negative for cough, shortness of breath and wheezing.   Cardiovascular:  Negative for chest pain, palpitations and leg swelling.  Gastrointestinal:  Negative for abdominal pain, blood in stool, constipation, diarrhea and nausea.  Endocrine: Negative for polydipsia.  Genitourinary:  Negative for dysuria, frequency, hematuria and urgency.  Musculoskeletal:  Negative for back pain, myalgias and neck pain.  Skin:  Negative for rash.  Allergic/Immunologic: Negative for environmental allergies.  Neurological:  Negative for dizziness, tingling, numbness and headaches.  Hematological:  Does not bruise/bleed easily.  Psychiatric/Behavioral:  Negative for suicidal ideas. The patient is not  nervous/anxious.    There are no problems to display for this patient.   Allergies  Allergen Reactions   Sulfa Antibiotics     Past Surgical History:  Procedure Laterality Date   BREAST LUMPECTOMY     KIDNEY SURGERY     VAGINAL HYSTERECTOMY      Social History   Tobacco Use   Smoking status: Former    Packs/day: 0.25    Years: 15.00    Pack years: 3.75    Types: Cigarettes    Quit date: 2004    Years since quitting: 18.7   Smokeless tobacco: Never   Tobacco comments:    smoking cessation materials not required  Vaping Use   Vaping Use: Never used  Substance Use Topics   Alcohol use: Yes    Alcohol/week: 1.0 standard drink    Types: 1 Glasses of wine per week    Comment: occasionally   Drug use: No     Medication list has been reviewed and updated.  Current Meds  Medication Sig   Calcium Carbonate-Vitamin D (CALCIUM 500 + D) 500-125 MG-UNIT TABS Take by mouth.   Naproxen Sodium 220 MG CAPS Take by mouth. As needed    PHQ 2/9 Scores 09/06/2020 04/28/2020 01/06/2020 05/08/2019  PHQ - 2 Score 0 0 0 0  PHQ- 9 Score 0 - 0 0    GAD 7 : Generalized Anxiety Score 09/06/2020 01/06/2020  Nervous, Anxious, on Edge 0 0  Control/stop worrying 0 0  Worry too much - different things 0 0  Trouble relaxing 0 0  Restless 0 0  Easily annoyed  or irritable 0 0  Afraid - awful might happen 0 0  Total GAD 7 Score 0 0  Anxiety Difficulty Not difficult at all -    BP Readings from Last 3 Encounters:  09/06/20 (!) 148/90  01/06/20 120/70  05/08/19 130/80    Physical Exam Vitals and nursing note reviewed.  Constitutional:      Appearance: She is well-developed.  HENT:     Head: Normocephalic.     Right Ear: Tympanic membrane, ear canal and external ear normal. There is no impacted cerumen.     Left Ear: Tympanic membrane, ear canal and external ear normal. There is no impacted cerumen.     Nose: No congestion or rhinorrhea.     Mouth/Throat:     Mouth: Mucous  membranes are moist.  Eyes:     General: Lids are everted, no foreign bodies appreciated. No scleral icterus.       Left eye: No foreign body or hordeolum.     Conjunctiva/sclera: Conjunctivae normal.     Right eye: Right conjunctiva is not injected.     Left eye: Left conjunctiva is not injected.     Pupils: Pupils are equal, round, and reactive to light.  Neck:     Thyroid: No thyromegaly.     Vascular: No JVD.     Trachea: No tracheal deviation.  Cardiovascular:     Rate and Rhythm: Normal rate and regular rhythm.     Heart sounds: Normal heart sounds. No murmur heard.   No friction rub. No gallop.  Pulmonary:     Effort: Pulmonary effort is normal. No respiratory distress.     Breath sounds: Normal breath sounds. No stridor. No wheezing, rhonchi or rales.  Abdominal:     General: Bowel sounds are normal.     Palpations: Abdomen is soft. There is no mass.     Tenderness: There is no abdominal tenderness. There is no guarding or rebound.  Musculoskeletal:        General: No tenderness. Normal range of motion.     Cervical back: Normal range of motion and neck supple.  Lymphadenopathy:     Cervical: No cervical adenopathy.  Skin:    General: Skin is warm.     Findings: No rash.  Neurological:     Mental Status: She is alert and oriented to person, place, and time.     Cranial Nerves: No cranial nerve deficit.     Deep Tendon Reflexes: Reflexes normal.  Psychiatric:        Mood and Affect: Mood is not anxious or depressed.    Wt Readings from Last 3 Encounters:  05/30/21 155 lb (70.3 kg)  09/06/20 159 lb (72.1 kg)  01/06/20 165 lb (74.8 kg)    Ht 5\' 6"  (1.676 m)   Wt 155 lb (70.3 kg)   BMI 25.02 kg/m   Assessment and Plan:  1. Health care maintenance Patient returns for healthcare maintenance reviewing colonoscopy screening and other metrics value.  2. Chronic pain of right knee Chronic.  But presenting today with significant knee pain which has not gotten  better over the course of over-the-counter Voltaren.  We will initiate meloxicam 7.5 mg once a day and obtain 4 views of the knee. - DG Knee Complete 4 Views Right; Future - meloxicam (MOBIC) 7.5 MG tablet; Take 1 tablet (7.5 mg total) by mouth daily.  Dispense: 30 tablet; Refill: 0  3. Colon cancer screening Discussed with patient and we will do  fecal occult blood for colon cancer screening purposes. - Fecal occult blood, imunochemical([facility])

## 2021-06-17 ENCOUNTER — Encounter: Payer: PPO | Admitting: Family Medicine

## 2021-06-24 DIAGNOSIS — Z1211 Encounter for screening for malignant neoplasm of colon: Secondary | ICD-10-CM | POA: Diagnosis not present

## 2021-06-24 LAB — FECAL OCCULT BLOOD, IMMUNOCHEMICAL: IFOBT: NEGATIVE

## 2021-06-26 LAB — FECAL OCCULT BLOOD, IMMUNOCHEMICAL: Fecal Occult Bld: NEGATIVE

## 2021-08-02 DIAGNOSIS — H2513 Age-related nuclear cataract, bilateral: Secondary | ICD-10-CM | POA: Diagnosis not present

## 2021-11-16 ENCOUNTER — Telehealth: Payer: Self-pay | Admitting: Family Medicine

## 2021-11-16 NOTE — Telephone Encounter (Signed)
Copied from CRM [id number]. Topic: Medicare AWV ?>> Nov 16, 2021  8:46 AM Claudette Laws R wrote: ?Reason for CRM:  ?Left message for patient to call back and schedule Medicare Annual Wellness Visit (AWV) in office.  ? ?If unable to come into the office for AWV,  please offer to do virtually or by telephone. ? ?Last AWV: 04/28/2020 ? ?Please schedule at anytime with [facility] Health Advisor.     ? ?40 Minutes appointment  ? ?Any questions, please call me at [phone] ?

## 2021-11-28 ENCOUNTER — Telehealth: Payer: Self-pay | Admitting: Family Medicine

## 2021-11-28 NOTE — Telephone Encounter (Signed)
Copied from CRM [id number]. Topic: Medicare AWV ?>> Nov 28, 2021  2:22 PM Claudette Laws R wrote: ?Reason for CRM:  ?Good afternoon. ? ?Due to a un-schedule change, we have had to reschedule your medicare wellness visit from  today  to December 05, 2021 at 1:20 pm.  ? ?This visit will be completed by telephone.  ? ?If this is not a good day or time, please contact me directly at [phone] or you can call the office at [phone]. ? ?Thank you, ? ?Judeth Cornfield ?

## 2021-12-12 ENCOUNTER — Ambulatory Visit (INDEPENDENT_AMBULATORY_CARE_PROVIDER_SITE_OTHER): Payer: PPO

## 2021-12-12 DIAGNOSIS — Z0000 Encounter for general adult medical examination without abnormal findings: Secondary | ICD-10-CM | POA: Diagnosis not present

## 2021-12-12 NOTE — Patient Instructions (Signed)
Heidi Bauer , ?Thank you for taking time to come for your Medicare Wellness Visit. I appreciate your ongoing commitment to your health goals. Please review the following plan we discussed and let me know if I can assist you in the future.  ? ?Screening recommendations/referrals: ?Colonoscopy: FOBT done 06/24/21 ?Mammogram: declined ?Bone Density: done 01/19/20 ?Recommended yearly ophthalmology/optometry visit for glaucoma screening and checkup ?Recommended yearly dental visit for hygiene and checkup ? ?Vaccinations: ?Influenza vaccine: declined ?Pneumococcal vaccine: declined ?Tdap vaccine: due ?Shingles vaccine: Shingrix discussed. Please contact your pharmacy for coverage information.  ?Covid-19:done 09/29/19, 10/20/19, 06/25/20 & 06/20/21 ? ?Advanced directives: Please bring a copy of your health care power of attorney and living will to the office at your convenience.  ? ?Conditions/risks identified: Keep up the great work! ? ?Next appointment: Follow up in one year for your annual wellness visit  ? ? ?Preventive Care 75 Years and Older, Female ?Preventive care refers to lifestyle choices and visits with your health care provider that can promote health and wellness. ?What does preventive care include? ?A yearly physical exam. This is also called an annual well check. ?Dental exams once or twice a year. ?Routine eye exams. Ask your health care provider how often you should have your eyes checked. ?Personal lifestyle choices, including: ?Daily care of your teeth and gums. ?Regular physical activity. ?Eating a healthy diet. ?Avoiding tobacco and drug use. ?Limiting alcohol use. ?Practicing safe sex. ?Taking low-dose aspirin every day. ?Taking vitamin and mineral supplements as recommended by your health care provider. ?What happens during an annual well check? ?The services and screenings done by your health care provider during your annual well check will depend on your age, overall health, lifestyle risk factors, and  family history of disease. ?Counseling  ?Your health care provider may ask you questions about your: ?Alcohol use. ?Tobacco use. ?Drug use. ?Emotional well-being. ?Home and relationship well-being. ?Sexual activity. ?Eating habits. ?History of falls. ?Memory and ability to understand (cognition). ?Work and work Astronomer. ?Reproductive health. ?Screening  ?You may have the following tests or measurements: ?Height, weight, and BMI. ?Blood pressure. ?Lipid and cholesterol levels. These may be checked every 5 years, or more frequently if you are over 75 years old. ?Skin check. ?Lung cancer screening. You may have this screening every year starting at age 75 if you have a 30-pack-year history of smoking and currently smoke or have quit within the past 15 years. ?Fecal occult blood test (FOBT) of the stool. You may have this test every year starting at age 75. ?Flexible sigmoidoscopy or colonoscopy. You may have a sigmoidoscopy every 5 years or a colonoscopy every 10 years starting at age 75. ?Hepatitis C blood test. ?Hepatitis B blood test. ?Sexually transmitted disease (STD) testing. ?Diabetes screening. This is done by checking your blood sugar (glucose) after you have not eaten for a while (fasting). You may have this done every 1-3 years. ?Bone density scan. This is done to screen for osteoporosis. You may have this done starting at age 75. ?Mammogram. This may be done every 1-2 years. Talk to your health care provider about how often you should have regular mammograms. ?Talk with your health care provider about your test results, treatment options, and if necessary, the need for more tests. ?Vaccines  ?Your health care provider may recommend certain vaccines, such as: ?Influenza vaccine. This is recommended every year. ?Tetanus, diphtheria, and acellular pertussis (Tdap, Td) vaccine. You may need a Td booster every 10 years. ?Zoster vaccine. You may need  this after age 75. ?Pneumococcal 13-valent conjugate  (PCV13) vaccine. One dose is recommended after age 2. ?Pneumococcal polysaccharide (PPSV23) vaccine. One dose is recommended after age 75. ?Talk to your health care provider about which screenings and vaccines you need and how often you need them. ?This information is not intended to replace advice given to you by your health care provider. Make sure you discuss any questions you have with your health care provider. ?Document Released: 09/24/2015 Document Revised: 05/17/2016 Document Reviewed: 06/29/2015 ?Elsevier Interactive Patient Education ? 2017 [employer]. ? ?Fall Prevention in the Home ?Falls can cause injuries. They can happen to people of all ages. There are many things you can do to make your home safe and to help prevent falls. ?What can I do on the outside of my home? ?Regularly fix the edges of walkways and driveways and fix any cracks. ?Remove anything that might make you trip as you walk through a door, such as a raised step or threshold. ?Trim any bushes or trees on the path to your home. ?Use bright outdoor lighting. ?Clear any walking paths of anything that might make someone trip, such as rocks or tools. ?Regularly check to see if handrails are loose or broken. Make sure that both sides of any steps have handrails. ?Any raised decks and porches should have guardrails on the edges. ?Have any leaves, snow, or ice cleared regularly. ?Use sand or salt on walking paths during winter. ?Clean up any spills in your garage right away. This includes oil or grease spills. ?What can I do in the bathroom? ?Use night lights. ?Install grab bars by the toilet and in the tub and shower. Do not use towel bars as grab bars. ?Use non-skid mats or decals in the tub or shower. ?If you need to sit down in the shower, use a plastic, non-slip stool. ?Keep the floor dry. Clean up any water that spills on the floor as soon as it happens. ?Remove soap buildup in the tub or shower regularly. ?Attach bath mats securely with  double-sided non-slip rug tape. ?Do not have throw rugs and other things on the floor that can make you trip. ?What can I do in the bedroom? ?Use night lights. ?Make sure that you have a light by your bed that is easy to reach. ?Do not use any sheets or blankets that are too big for your bed. They should not hang down onto the floor. ?Have a firm chair that has side arms. You can use this for support while you get dressed. ?Do not have throw rugs and other things on the floor that can make you trip. ?What can I do in the kitchen? ?Clean up any spills right away. ?Avoid walking on wet floors. ?Keep items that you use a lot in easy-to-reach places. ?If you need to reach something above you, use a strong step stool that has a grab bar. ?Keep electrical cords out of the way. ?Do not use floor polish or wax that makes floors slippery. If you must use wax, use non-skid floor wax. ?Do not have throw rugs and other things on the floor that can make you trip. ?What can I do with my stairs? ?Do not leave any items on the stairs. ?Make sure that there are handrails on both sides of the stairs and use them. Fix handrails that are broken or loose. Make sure that handrails are as long as the stairways. ?Check any carpeting to make sure that it is firmly attached to  the stairs. Fix any carpet that is loose or worn. ?Avoid having throw rugs at the top or bottom of the stairs. If you do have throw rugs, attach them to the floor with carpet tape. ?Make sure that you have a light switch at the top of the stairs and the bottom of the stairs. If you do not have them, ask someone to add them for you. ?What else can I do to help prevent falls? ?Wear shoes that: ?Do not have high heels. ?Have rubber bottoms. ?Are comfortable and fit you well. ?Are closed at the toe. Do not wear sandals. ?If you use a stepladder: ?Make sure that it is fully opened. Do not climb a closed stepladder. ?Make sure that both sides of the stepladder are locked  into place. ?Ask someone to hold it for you, if possible. ?Clearly mark and make sure that you can see: ?Any grab bars or handrails. ?First and last steps. ?Where the edge of each step is. ?Use tools that hel

## 2021-12-12 NOTE — Progress Notes (Signed)
? ?Subjective:  ? Heidi Bauer is a 76 y.o. female who presents for Medicare Annual (Subsequent) preventive examination. ? ?Virtual Visit via Telephone Note ? ?I connected with  Heidi Bauer on 12/12/21 at  3:30 PM EDT by telephone and verified that I am speaking with the correct person using two identifiers. ? ?Location: ?Patient: home ?[facility] ?Persons participating in the virtual visit: patient/Nurse Health Advisor ?  ?I discussed the limitations, risks, security and privacy concerns of performing an evaluation and management service by telephone and the availability of in person appointments. The patient expressed understanding and agreed to proceed. ? ?Interactive audio and video telecommunications were attempted between this nurse and patient, however failed, due to patient having technical difficulties OR patient did not have access to video capability.  We continued and completed visit with audio only. ? ?Some vital signs may be absent or patient reported.  ? ?Reather LittlerKasey Rakin Lemelle, LPN ? ? ?Review of Systems    ? ?Cardiac Risk Factors include: advanced age (>[id number], 47>65 women) ? ?   ?Objective:  ?  ?There were no vitals filed for this visit. ?There is no height or weight on file to calculate BMI. ? ? ?  12/12/2021  ?  3:39 PM 04/28/2020  ?  1:28 PM 04/24/2018  ?  1:29 PM 04/23/2017  ? 11:04 AM 07/21/2015  ?  9:58 AM 04/09/2015  ?  2:02 PM  ?Advanced Directives  ?Does Patient Have a Medical Advance Directive? Yes Yes Yes Yes Yes Yes  ?Type of Estate agentAdvance Directive Healthcare Power of [place]orney;Living will Healthcare Power of [place]Attorney;Living will Healthcare Power of [place];Living will Healthcare Power of [place]orney;Living will Healthcare Power of [place];Living will Living will  ?Copy of Healthcare Power of Attorney in Chart? No - copy requested No - copy requested No - copy requested No - copy requested No - copy requested No - copy requested  ? ? ?Current Medications (verified) ?Outpatient Encounter Medications as of  12/12/2021  ?Medication Sig  ? Calcium Carbonate-Vitamin D (CALCIUM 500 + D) 500-125 MG-UNIT TABS Take by mouth.  ? [DISCONTINUED] meloxicam (MOBIC) 7.5 MG tablet Take 1 tablet (7.5 mg total) by mouth daily.  ? ?No facility-administered encounter medications on file as of 12/12/2021.  ? ? ?Allergies (verified) ?Sulfa antibiotics  ? ?History: ?Past Medical History:  ?Diagnosis Date  ? Interstitial cystitis   ? ?Past Surgical History:  ?Procedure Laterality Date  ? BREAST LUMPECTOMY    ? KIDNEY SURGERY    ? VAGINAL HYSTERECTOMY    ? ?Family History  ?Problem Relation Age of Onset  ? Hypertension Mother   ? ?Social History  ? ?Socioeconomic History  ? Marital status: Married  ?  Spouse name: Not on file  ? Number of children: 2  ? Years of education: Not on file  ? Highest education level: Associate degree: academic program  ?Occupational History  ? Occupation: Retired  ?Tobacco Use  ? Smoking status: Former  ?  Packs/day: 0.25  ?  Years: 15.00  ?  Pack years: 3.75  ?  Types: Cigarettes  ?  Quit date: 2004  ?  Years since quitting: 19.2  ? Smokeless tobacco: Never  ? Tobacco comments:  ?  smoking cessation materials not required  ?Vaping Use  ? Vaping Use: Never used  ?Substance and Sexual Activity  ? Alcohol use: Yes  ?  Alcohol/week: 1.0 standard drink  ?  Types: 1 Glasses of wine per week  ?  Comment: occasionally  ?  Drug use: No  ? Sexual activity: Yes  ?Other Topics Concern  ? Not on file  ?Social History Narrative  ? Not on file  ? ?Social Determinants of Health  ? ?Financial Resource Strain: Low Risk   ? Difficulty of Paying Living Expenses: Not hard at all  ?Food Insecurity: No Food Insecurity  ? Worried About Programme researcher, broadcasting/film/video in the Last Year: Never true  ? Ran Out of Food in the Last Year: Never true  ?Transportation Needs: No Transportation Needs  ? Lack of Transportation (Medical): No  ? Lack of Transportation (Non-Medical): No  ?Physical Activity: Sufficiently Active  ? Days of Exercise per Week: 3 days  ?  Minutes of Exercise per Session: 50 min  ?Stress: No Stress Concern Present  ? Feeling of Stress : Not at all  ?Social Connections: Socially Integrated  ? Frequency of Communication with Friends and Family: More than three times a week  ? Frequency of Social Gatherings with Friends and Family: Twice a week  ? Attends Religious Services: More than 4 times per year  ? Active Member of Clubs or Organizations: Yes  ? Attends Banker Meetings: More than 4 times per year  ? Marital Status: Married  ? ? ?Tobacco Counseling ?Counseling given: Not Answered ?Tobacco comments: smoking cessation materials not required ? ? ?Clinical Intake: ? ?Pre-visit preparation completed: Yes ? ?Pain : No/denies pain ? ?  ? ?Nutritional Risks: None ?Diabetes: No ? ?How often do you need to have someone help you when you read instructions, pamphlets, or other written materials from your doctor or pharmacy?: 1 - Never ? ? ? ?Interpreter Needed?: No ? ?Information entered by :: Reather Littler LPN ? ? ?Activities of Daily Living ? ?  12/12/2021  ?  3:41 PM 05/30/2021  ?  3:19 PM  ?In your present state of health, do you have any difficulty performing the following activities:  ?Hearing? 0 0  ?Vision? 0 0  ?Difficulty concentrating or making decisions? 0 0  ?Walking or climbing stairs? 0 0  ?Dressing or bathing? 0 0  ?Doing errands, shopping? 0 0  ?Preparing Food and eating ? N   ?Using the Toilet? N   ?In the past six months, have you accidently leaked urine? N   ?Do you have problems with loss of bowel control? N   ?Managing your Medications? N   ?Managing your Finances? N   ?Housekeeping or managing your Housekeeping? N   ? ? ?Patient Care Team: ?Duanne Limerick, MD as PCP - General (Family Medicine) ? ?Indicate any recent Medical Services you may have received from other than [facility] providers in the past year (date may be approximate). ? ?   ?Assessment:  ? This is a routine wellness examination for [facility] ? ?Hearing/Vision screen ?Hearing  Screening - Comments:: Pt denies hearing difficulty  ?Vision Screening - Comments:: Annual vision screenings at [facility] Dr. Dorcas Mcmurray ? ?Dietary issues and exercise activities discussed: ?Current Exercise Habits: Home exercise routine, Type of exercise: Other - see comments (exercise bike), Time (Minutes): 50, Frequency (Times/Week): 3, Weekly Exercise (Minutes/Week): 150, Intensity: Moderate, Exercise limited by: None identified ? ? Goals Addressed   ? ?  ?  ?  ?  ? This Visit's Progress  ?  DIET - INCREASE WATER INTAKE   On track  ?  Recommend to drink at least 6-8 8oz glasses of water per day. ?  ? ?  ? ?Depression Screen ? ?  12/12/2021  ?  3:38 PM 05/30/2021  ?  3:23 PM 09/06/2020  ?  9:55 AM 04/28/2020  ?  1:27 PM 01/06/2020  ?  8:07 AM 05/08/2019  ?  2:10 PM 04/24/2018  ?  1:28 PM  ?PHQ 2/9 Scores  ?PHQ - 2 Score 0 0 0 0 0 0 0  ?PHQ- 9 Score  0 0  0 0 0  ?  ?Fall Risk ? ?  12/12/2021  ?  3:40 PM 05/30/2021  ?  3:23 PM 09/06/2020  ?  9:55 AM 04/28/2020  ?  1:28 PM 01/06/2020  ?  8:07 AM  ?Fall Risk   ?Falls in the past year? 0 0 0 0 0  ?Number falls in past yr: 0 0 0 0   ?Injury with Fall? 0 0 0 0   ?Risk for fall due to : No Fall Risks No Fall Risks No Fall Risks No Fall Risks   ?Follow up Falls prevention discussed Falls evaluation completed Falls evaluation completed Falls prevention discussed Falls evaluation completed  ? ? ?FALL RISK PREVENTION PERTAINING TO THE HOME: ? ?Any stairs in or around the home? Yes  ?If so, are there any without handrails? No  ?Home free of loose throw rugs in walkways, pet beds, electrical cords, etc? Yes  ?Adequate lighting in your home to reduce risk of falls? Yes  ? ?ASSISTIVE DEVICES UTILIZED TO PREVENT FALLS: ? ?Life alert? No  ?Use of a cane, walker or w/c? No  ?Grab bars in the bathroom? No  ?Shower chair or bench in shower? No  ?Elevated toilet seat or a handicapped toilet? No  ? ?TIMED UP AND GO: ? ?Was the test performed? No . Telephonic visit.  ? ?Cognitive  Function: Normal cognitive status assessed by direct observation by this Nurse Health Advisor. No abnormalities found.  ? ?  ?  ? ?  04/24/2018  ?  1:31 PM 04/23/2017  ? 11:04 AM  ?6CIT Screen  ?What Year? 0 points

## 2022-05-09 ENCOUNTER — Ambulatory Visit (INDEPENDENT_AMBULATORY_CARE_PROVIDER_SITE_OTHER): Payer: PPO | Admitting: Family Medicine

## 2022-05-09 ENCOUNTER — Encounter: Payer: Self-pay | Admitting: Family Medicine

## 2022-05-09 VITALS — BP 120/80 | HR 80 | Ht 66.0 in | Wt 155.0 lb

## 2022-05-09 DIAGNOSIS — R69 Illness, unspecified: Secondary | ICD-10-CM

## 2022-05-09 DIAGNOSIS — G8929 Other chronic pain: Secondary | ICD-10-CM

## 2022-05-09 DIAGNOSIS — M25561 Pain in right knee: Secondary | ICD-10-CM

## 2022-05-09 MED ORDER — MELOXICAM 15 MG PO TABS: 15.0000 mg | ORAL_TABLET | Freq: Every day | ORAL | 3 refills | Status: DC

## 2022-05-09 NOTE — Progress Notes (Signed)
Date:  05/09/2022   Name:  Heidi Bauer   DOB:  Jan 16, 1946   MRN:  [id number]   Chief Complaint: Annual Exam  Patient is a 76 year old female who presents for a health maintenance exam. The patient reports the following problems: knee pain. Health maintenance has been reviewed up to date.     No results found for: "NA", "K", "CO2", "GLUCOSE", "BUN", "CREATININE", "CALCIUM", "EGFR", "GFRNONAA" No results found for: "CHOL", "HDL", "LDLCALC", "LDLDIRECT", "TRIG", "CHOLHDL" No results found for: "TSH" No results found for: "HGBA1C" No results found for: "WBC", "HGB", "HCT", "MCV", "PLT" No results found for: "ALT", "AST", "GGT", "ALKPHOS", "BILITOT" No results found for: "25OHVITD2", "25OHVITD3", "VD25OH"   Review of Systems  Constitutional:  Negative for chills and fever.  HENT:  Negative for drooling, ear discharge, ear pain and sore throat.   Respiratory:  Negative for cough, shortness of breath and wheezing.   Cardiovascular:  Negative for chest pain, palpitations and leg swelling.  Gastrointestinal:  Negative for abdominal pain, blood in stool, constipation, diarrhea and nausea.  Endocrine: Negative for polydipsia.  Genitourinary:  Negative for dysuria, frequency, hematuria and urgency.  Musculoskeletal:  Negative for back pain, myalgias and neck pain.  Skin:  Negative for rash.  Allergic/Immunologic: Negative for environmental allergies.  Neurological:  Negative for dizziness and headaches.  Hematological:  Does not bruise/bleed easily.  Psychiatric/Behavioral:  Negative for suicidal ideas. The patient is not nervous/anxious.     There are no problems to display for this patient.   Allergies  Allergen Reactions  . Sulfa Antibiotics     Past Surgical History:  Procedure Laterality Date  . BREAST LUMPECTOMY    . KIDNEY SURGERY    . VAGINAL HYSTERECTOMY      Social History   Tobacco Use  . Smoking status: Former    Packs/day: 0.25    Years: 15.00    Total pack  years: 3.75    Types: Cigarettes    Quit date: 2004    Years since quitting: 19.6  . Smokeless tobacco: Never  . Tobacco comments:    smoking cessation materials not required  Vaping Use  . Vaping Use: Never used  Substance Use Topics  . Alcohol use: Yes    Alcohol/week: 1.0 standard drink of alcohol    Types: 1 Glasses of wine per week    Comment: occasionally  . Drug use: No     Medication list has been reviewed and updated.  Current Meds  Medication Sig  . Calcium Carbonate-Vitamin D (CALCIUM 500 + D) 500-125 MG-UNIT TABS Take by mouth.       05/09/2022    2:30 PM 05/30/2021    3:23 PM 09/06/2020    9:55 AM 01/06/2020    8:07 AM  GAD 7 : Generalized Anxiety Score  Nervous, Anxious, on Edge 0 0 0 0  Control/stop worrying 0 0 0 0  Worry too much - different things 0 0 0 0  Trouble relaxing 0 0 0 0  Restless 0 0 0 0  Easily annoyed or irritable 0 0 0 0  Afraid - awful might happen 0 0 0 0  Total GAD 7 Score 0 0 0 0  Anxiety Difficulty Not difficult at all  Not difficult at all        05/09/2022    2:30 PM 12/12/2021    3:38 PM 05/30/2021    3:23 PM  Depression screen PHQ 2/9  Decreased Interest 0 0  0  Down, Depressed, Hopeless 0 0 0  PHQ - 2 Score 0 0 0  Altered sleeping 0  0  Tired, decreased energy 0  0  Change in appetite 0  0  Feeling bad or failure about yourself  0  0  Trouble concentrating 0  0  Moving slowly or fidgety/restless 0  0  Suicidal thoughts 0  0  PHQ-9 Score 0  0  Difficult doing work/chores Not difficult at all      BP Readings from Last 3 Encounters:  05/09/22 120/80  05/30/21 136/78  09/06/20 (!) 148/90    Physical Exam Vitals and nursing note reviewed.  Constitutional:      Appearance: She is well-developed.  HENT:     Head: Normocephalic.     Right Ear: External ear normal.     Left Ear: External ear normal.  Eyes:     General: Lids are everted, no foreign bodies appreciated. No scleral icterus.       Left eye: No foreign  body or hordeolum.     Conjunctiva/sclera: Conjunctivae normal.     Right eye: Right conjunctiva is not injected.     Left eye: Left conjunctiva is not injected.     Pupils: Pupils are equal, round, and reactive to light.  Neck:     Thyroid: No thyromegaly.     Vascular: No JVD.     Trachea: No tracheal deviation.  Cardiovascular:     Rate and Rhythm: Normal rate and regular rhythm.     Heart sounds: Normal heart sounds. No murmur heard.    No friction rub. No gallop.  Pulmonary:     Effort: Pulmonary effort is normal. No respiratory distress.     Breath sounds: Normal breath sounds. No wheezing, rhonchi or rales.  Abdominal:     General: Bowel sounds are normal.     Palpations: Abdomen is soft. There is no mass.     Tenderness: There is no abdominal tenderness. There is no guarding or rebound.  Musculoskeletal:        General: No tenderness. Normal range of motion.     Cervical back: Normal range of motion and neck supple.  Lymphadenopathy:     Cervical: No cervical adenopathy.  Skin:    General: Skin is warm.     Findings: No rash.  Neurological:     Mental Status: She is alert and oriented to person, place, and time.     Cranial Nerves: No cranial nerve deficit.     Deep Tendon Reflexes: Reflexes normal.  Psychiatric:        Mood and Affect: Mood is not anxious or depressed.    Wt Readings from Last 3 Encounters:  05/09/22 155 lb (70.3 kg)  05/30/21 155 lb (70.3 kg)  09/06/20 159 lb (72.1 kg)    BP 120/80   Pulse 80   Ht 5' 6"  (1.676 m)   Wt 155 lb (70.3 kg)   BMI 25.02 kg/m   Assessment and Plan:  1. Chronic pain of right knee Chronic.  Controlled.  Stable.  PVCs with treated with meloxicam.  Excellent results but does not sustainable.  At this point in time we will need to go the next step diagnostics and therapeutics and we will refer to sports medicine.  We will refill meloxicam 15 mg twice a day.  Patient renal function, shortness of  2. Taking medication  for chronic disease Chronic. .  Stable.  Chronic.  Dependent on  Issues" prescribing . We will check renal function panel.    Otilio Miu, MD

## 2022-05-18 ENCOUNTER — Ambulatory Visit: Payer: PPO | Admitting: Family Medicine

## 2022-05-18 ENCOUNTER — Encounter: Payer: Self-pay | Admitting: Family Medicine

## 2022-05-18 VITALS — BP 130/74 | HR 90 | Ht 66.0 in | Wt 156.6 lb

## 2022-05-18 DIAGNOSIS — M7051 Other bursitis of knee, right knee: Secondary | ICD-10-CM | POA: Diagnosis not present

## 2022-05-18 DIAGNOSIS — M1711 Unilateral primary osteoarthritis, right knee: Secondary | ICD-10-CM | POA: Diagnosis not present

## 2022-05-18 NOTE — Patient Instructions (Signed)
-   Dose meloxicam x1 week then once daily as needed for knee pain (take with food) - Start physical therapy, referral coordinator will contact you for scheduling - Can utilize ice alternating with heat for additional symptom control - Return for follow-up in 6 weeks, contact for questions between now and then

## 2022-05-18 NOTE — Assessment & Plan Note (Signed)
See additional assessment(s) for plan details. 

## 2022-05-18 NOTE — Assessment & Plan Note (Signed)
Acute on chronic symptoms ongoing for years, progressively worsening over the past 6 months, denies any trauma or change in activity at most recent exacerbation.  Pain localized anteromedial right knee without radiation, intermittent, aggravated by sitting to standing after period of immobility, weightbearing, can be sporadic.  She has noted associated swelling but otherwise denies mechanical symptoms.  Examination reveals fullness about the pes anserine bursa, mild tenderness with deep palpation about the medial joint line and pes anserine bursa, there is crepitus with passive flexion/extension, otherwise provocative testing is negative.  Given her radiographs revealing mild osteoarthritis, findings today, primary concern for osteoarthritis related arthralgia and secondary/compensatory pes anserine bursitis.  Have advised patient and her husband about the same in addition to treatment strategies, will go on 1 week course of scheduled meloxicam, thereafter once daily as needed, formal physical therapy, and close follow-up in 6 weeks for reevaluation.  Suboptimal progress can be addressed with intra-articular and/or pes anserine bursa cortisone injections under ultrasound guidance.

## 2022-05-18 NOTE — Progress Notes (Signed)
     Primary Care / Sports Medicine Office Visit  Patient Information:  Patient ID: Heidi Bauer, female DOB: Jan 30, 1946 Age: 76 y.o. MRN: [id number]   Heidi Bauer is a pleasant 76 y.o. female presenting with the following:  Chief Complaint  Patient presents with   Chronic pain of right knee    Pt states it has been bothering her for about 6 months. States meloxicam is helping some.     Vitals:   05/18/22 1423  BP: 130/74  Pulse: 90  SpO2: 98%   Vitals:   05/18/22 1423  Weight: 156 lb 9.6 oz (71 kg)  Height: 5\' 6"  (1.676 m)   Body mass index is 25.28 kg/m.  No results found.   Independent interpretation of notes and tests performed by another provider:   Independent interpretation of right knee x-rays from 2022 reveal mild medial tibiofemoral and patellofemoral degenerative changes with subtle joint space narrowing and osteophytes, there is soft tissue shadow enlargement inferior to the patella and subtle lucency superior to the patella, the latter consistent with effusion.  No acute osseous processes identified.  Procedures performed:   None  Pertinent History, Exam, Impression, and Recommendations:   Problem List Items Addressed This Visit       Musculoskeletal and Integument   Primary osteoarthritis of right knee - Primary    Acute on chronic symptoms ongoing for years, progressively worsening over the past 6 months, denies any trauma or change in activity at most recent exacerbation.  Pain localized anteromedial right knee without radiation, intermittent, aggravated by sitting to standing after period of immobility, weightbearing, can be sporadic.  She has noted associated swelling but otherwise denies mechanical symptoms.  Examination reveals fullness about the pes anserine bursa, mild tenderness with deep palpation about the medial joint line and pes anserine bursa, there is crepitus with passive flexion/extension, otherwise provocative testing is negative.  Given  her radiographs revealing mild osteoarthritis, findings today, primary concern for osteoarthritis related arthralgia and secondary/compensatory pes anserine bursitis.  Have advised patient and her husband about the same in addition to treatment strategies, will go on 1 week course of scheduled meloxicam, thereafter once daily as needed, formal physical therapy, and close follow-up in 6 weeks for reevaluation.  Suboptimal progress can be addressed with intra-articular and/or pes anserine bursa cortisone injections under ultrasound guidance.      Relevant Orders   Ambulatory referral to Physical Therapy   Pes anserinus bursitis of right knee    See additional assessment(s) for plan details.      Relevant Orders   Ambulatory referral to Physical Therapy     Orders & Medications No orders of the defined types were placed in this encounter.  Orders Placed This Encounter  Procedures   Ambulatory referral to Physical Therapy     Return in about 6 weeks (around 06/29/2022).     07/01/2022, MD   Primary Care Sports Medicine [facility]

## 2022-05-23 ENCOUNTER — Other Ambulatory Visit: Payer: Self-pay

## 2022-05-23 DIAGNOSIS — Z0000 Encounter for general adult medical examination without abnormal findings: Secondary | ICD-10-CM

## 2022-05-23 DIAGNOSIS — R69 Illness, unspecified: Secondary | ICD-10-CM

## 2022-05-23 NOTE — Progress Notes (Signed)
Printed lipid and renal

## 2022-05-29 DIAGNOSIS — Z0000 Encounter for general adult medical examination without abnormal findings: Secondary | ICD-10-CM | POA: Diagnosis not present

## 2022-05-29 DIAGNOSIS — R69 Illness, unspecified: Secondary | ICD-10-CM | POA: Diagnosis not present

## 2022-05-30 LAB — RENAL FUNCTION PANEL
Albumin: 4.3 g/dL (ref 3.8–4.8)
BUN/Creatinine Ratio: 17 (ref 12–28)
BUN: 18 mg/dL (ref 8–27)
CO2: 26 mmol/L (ref 20–29)
Calcium: 10 mg/dL (ref 8.7–10.3)
Chloride: 107 mmol/L — ABNORMAL HIGH (ref 96–106)
Creatinine, Ser: 1.04 mg/dL — ABNORMAL HIGH (ref 0.57–1.00)
Glucose: 97 mg/dL (ref 70–99)
Phosphorus: 3.2 mg/dL (ref 3.0–4.3)
Potassium: 5.3 mmol/L — ABNORMAL HIGH (ref 3.5–5.2)
Sodium: 145 mmol/L — ABNORMAL HIGH (ref 134–144)
eGFR: 56 mL/min/{1.73_m2} — ABNORMAL LOW (ref >59)

## 2022-05-30 LAB — LIPID PANEL WITH LDL/HDL RATIO
Cholesterol, Total: 248 mg/dL — ABNORMAL HIGH (ref 100–199)
HDL: 67 mg/dL (ref >39)
LDL Chol Calc (NIH): 163 mg/dL — ABNORMAL HIGH (ref 0–99)
LDL/HDL Ratio: 2.4 ratio (ref 0.0–3.2)
Triglycerides: 105 mg/dL (ref 0–149)
VLDL Cholesterol Cal: 18 mg/dL (ref 5–40)

## 2022-06-02 DIAGNOSIS — M25561 Pain in right knee: Secondary | ICD-10-CM | POA: Diagnosis not present

## 2022-06-07 DIAGNOSIS — M25561 Pain in right knee: Secondary | ICD-10-CM | POA: Diagnosis not present

## 2022-06-14 DIAGNOSIS — M25561 Pain in right knee: Secondary | ICD-10-CM | POA: Diagnosis not present

## 2022-06-16 DIAGNOSIS — M25561 Pain in right knee: Secondary | ICD-10-CM | POA: Diagnosis not present

## 2022-06-23 DIAGNOSIS — M25561 Pain in right knee: Secondary | ICD-10-CM | POA: Diagnosis not present

## 2022-06-27 DIAGNOSIS — M25561 Pain in right knee: Secondary | ICD-10-CM | POA: Diagnosis not present

## 2022-06-29 ENCOUNTER — Encounter: Payer: Self-pay | Admitting: Family Medicine

## 2022-06-29 ENCOUNTER — Ambulatory Visit: Payer: PPO | Admitting: Family Medicine

## 2022-06-29 VITALS — BP 126/86 | HR 72 | Ht 66.0 in | Wt 155.0 lb

## 2022-06-29 DIAGNOSIS — M7051 Other bursitis of knee, right knee: Secondary | ICD-10-CM

## 2022-06-29 DIAGNOSIS — M1711 Unilateral primary osteoarthritis, right knee: Secondary | ICD-10-CM | POA: Diagnosis not present

## 2022-06-29 MED ORDER — DICLOFENAC SODIUM 50 MG PO TBEC: 50.0000 mg | DELAYED_RELEASE_TABLET | Freq: Two times a day (BID) | ORAL | 0 refills | Status: DC | PRN

## 2022-06-29 NOTE — Assessment & Plan Note (Signed)
See additional assessment(s) for plan details. 

## 2022-06-29 NOTE — Assessment & Plan Note (Signed)
Heidi Bauer presents for follow-up to right knee pain in the setting of osteoarthritis and noted pes anserine bursitis. She was advised meloxicam, formal PT. Despite this she is still symptomatic.   Her examination demonstrates medial tibiofemoral tenderness and pes anserine tenderness with swelling.  Given treatments to date, did discuss additional treatment strategies and she will try brief course of diclofenac twice daily with food, close follow-up with low threshold to advance to corticosteroid injections.

## 2022-06-29 NOTE — Progress Notes (Signed)
     Primary Care / Sports Medicine Office Visit  Patient Information:  Patient ID: Heidi Bauer, female DOB: 1946-03-24 Age: 76 y.o. MRN: [id number]   Heidi Bauer is a pleasant 76 y.o. female presenting with the following:  Chief Complaint  Patient presents with   Primary osteoarthritis of right knee    Vitals:   06/29/22 1439  BP: 126/86  Pulse: 72  SpO2: 99%   Vitals:   06/29/22 1439  Weight: 155 lb (70.3 kg)  Height: 5\' 6"  (1.676 m)   Body mass index is 25.02 kg/m.  No results found.   Independent interpretation of notes and tests performed by another provider:   None  Procedures performed:   None  Pertinent History, Exam, Impression, and Recommendations:   Problem List Items Addressed This Visit       Musculoskeletal and Integument   Primary osteoarthritis of right knee - Primary    Ms. Albarracin presents for follow-up to right knee pain in the setting of osteoarthritis and noted pes anserine bursitis. She was advised meloxicam, formal PT. Despite this she is still symptomatic.   Her examination demonstrates medial tibiofemoral tenderness and pes anserine tenderness with swelling.  Given treatments to date, did discuss additional treatment strategies and she will try brief course of diclofenac twice daily with food, close follow-up with low threshold to advance to corticosteroid injections.      Relevant Medications   diclofenac (VOLTAREN) 50 MG EC tablet   Pes anserinus bursitis of right knee    See additional assessment(s) for plan details.      Relevant Medications   diclofenac (VOLTAREN) 50 MG EC tablet     Orders & Medications Meds ordered this encounter  Medications   diclofenac (VOLTAREN) 50 MG EC tablet    Sig: Take 1 tablet (50 mg total) by mouth 2 (two) times daily as needed.    Dispense:  60 tablet    Refill:  0   No orders of the defined types were placed in this encounter.    Return in about 4 days (around 07/03/2022).     Montel Culver, MD   Primary Care Sports Medicine [place]

## 2022-07-03 ENCOUNTER — Ambulatory Visit (INDEPENDENT_AMBULATORY_CARE_PROVIDER_SITE_OTHER): Payer: PPO | Admitting: Family Medicine

## 2022-07-03 ENCOUNTER — Encounter: Payer: Self-pay | Admitting: Family Medicine

## 2022-07-03 ENCOUNTER — Inpatient Hospital Stay (INDEPENDENT_AMBULATORY_CARE_PROVIDER_SITE_OTHER): Payer: PPO | Admitting: Radiology

## 2022-07-03 VITALS — BP 122/82 | HR 98 | Ht 66.0 in | Wt 155.0 lb

## 2022-07-03 DIAGNOSIS — M7051 Other bursitis of knee, right knee: Secondary | ICD-10-CM | POA: Diagnosis not present

## 2022-07-03 DIAGNOSIS — M1711 Unilateral primary osteoarthritis, right knee: Secondary | ICD-10-CM

## 2022-07-03 MED ORDER — TRIAMCINOLONE ACETONIDE 40 MG/ML IJ SUSP
80.0000 mg | Freq: Once | INTRAMUSCULAR | Status: AC
  Administered 2022-07-03: 80 mg via INTRAMUSCULAR

## 2022-07-03 MED ORDER — TRIAMCINOLONE ACETONIDE 40 MG/ML IJ SUSP: 40.0000 mg | Freq: Once | INTRAMUSCULAR | Status: DC

## 2022-07-03 NOTE — Assessment & Plan Note (Signed)
Persistent symptoms at both the medial joint as well as the pes anserine busa with some reported improvement from diclofenac. Given persistent pain she elected to proceed with ultrasound-guided corticosteroid injections at these sites. Post care reviewed, from a medication management standpoint I advised diclofenac PRN until symptoms respond to cortisone the discontinuation. Restart of exercises next week and follow-up as-needed.

## 2022-07-03 NOTE — Patient Instructions (Addendum)
You have just been given a cortisone injection to reduce pain and inflammation. After the injection you may notice immediate relief of pain as a result of the Lidocaine. It is important to rest the area of the injection for 24 to 48 hours after the injection. There is a possibility of some temporary increased discomfort and swelling for up to 72 hours until the cortisone begins to work. If you do have pain, simply rest the joint and use ice. If you can tolerate over the counter medications, you can try Tylenol for added relief per package instructions. - As above, relative rest x 2 days then gradual return to activity - Use diclofenac as-needed twice daily until symptoms respond to cortisone then discontinue - Restart exercises instructed by physical therapy next week and continue on a regular basis - Follow-up as-needed

## 2022-07-03 NOTE — Progress Notes (Signed)
     Primary Care / Sports Medicine Office Visit  Patient Information:  Patient ID: Heidi Bauer, female DOB: 1946/08/17 Age: 76 y.o. MRN: [id number]   Heidi Bauer is a pleasant 76 y.o. female presenting with the following:  Chief Complaint  Patient presents with   Procedure    Knee injection    Vitals:   07/03/22 1543  BP: 122/82  Pulse: 98  SpO2: 98%   Vitals:   07/03/22 1543  Weight: 155 lb (70.3 kg)  Height: 5\' 6"  (1.676 m)   Body mass index is 25.02 kg/m.  No results found.   Independent interpretation of notes and tests performed by another provider:   None  Procedures performed:   Procedure:  Injection of right intra-articular knee under ultrasound guidance. Ultrasound guidance utilized for anterolateral knee approach, no effusion noted Samsung HS60 device utilized with permanent recording / reporting. Verbal informed consent obtained and verified. Skin prepped in a sterile fashion. Ethyl chloride for topical local analgesia.  Completed without difficulty and tolerated well. Medication: triamcinolone acetonide 40 mg/mL suspension for injection 1 mL total and 2 mL lidocaine 1% without epinephrine utilized for needle placement anesthetic Advised to contact for fevers/chills, erythema, induration, drainage, or persistent bleeding.  Procedure:  Injection of right pes anserine bursa under ultrasound guidance. Ultrasound guidance utilized for out-of-plane approach to the right pes bursa, no significant hypoechoic region visualized Samsung HS60 device utilized with permanent recording / reporting. Verbal informed consent obtained and verified. Skin prepped in a sterile fashion. Ethyl chloride for topical local analgesia.  Completed without difficulty and tolerated well. Medication: triamcinolone acetonide 40 mg/mL suspension for injection 1 mL total and 2 mL lidocaine 1% without epinephrine utilized for needle placement anesthetic Advised to contact for  fevers/chills, erythema, induration, drainage, or persistent bleeding.   Pertinent History, Exam, Impression, and Recommendations:   Problem List Items Addressed This Visit       Musculoskeletal and Integument   Primary osteoarthritis of right knee - Primary    Persistent symptoms at both the medial joint as well as the pes anserine busa with some reported improvement from diclofenac. Given persistent pain she elected to proceed with ultrasound-guided corticosteroid injections at these sites. Post care reviewed, from a medication management standpoint I advised diclofenac PRN until symptoms respond to cortisone the discontinuation. Restart of exercises next week and follow-up as-needed.      Relevant Orders   [place] LIMITED JOINT SPACE STRUCTURES LOW RIGHT   Pes anserinus bursitis of right knee    See additional assessment(s) for plan details.        Orders & Medications Meds ordered this encounter  Medications   DISCONTD: triamcinolone acetonide (KENALOG-40) injection 40 mg   triamcinolone acetonide (KENALOG-40) injection 80 mg   Orders Placed This Encounter  Procedures   [place] LIMITED JOINT SPACE STRUCTURES LOW RIGHT     No follow-ups on file.     Montel Culver, MD   Primary Care Sports Medicine [place]

## 2022-07-03 NOTE — Assessment & Plan Note (Signed)
See additional assessment(s) for plan details. 

## 2022-08-07 DIAGNOSIS — H2513 Age-related nuclear cataract, bilateral: Secondary | ICD-10-CM | POA: Diagnosis not present

## 2022-11-01 ENCOUNTER — Telehealth: Payer: Self-pay | Admitting: Family Medicine

## 2022-11-01 NOTE — Telephone Encounter (Signed)
Pharmacy is calling because their systems are down and they want to know if a script could be taken over the phone. The patient is trying to refill diclofenac (VOLTAREN) 50 MG EC tablet [id number] . Pharmacy says you can follow up with them or send in the script.

## 2022-11-02 ENCOUNTER — Other Ambulatory Visit: Payer: Self-pay

## 2022-11-02 DIAGNOSIS — M7051 Other bursitis of knee, right knee: Secondary | ICD-10-CM

## 2022-11-02 DIAGNOSIS — M1711 Unilateral primary osteoarthritis, right knee: Secondary | ICD-10-CM

## 2022-11-02 MED ORDER — DICLOFENAC SODIUM 50 MG PO TBEC: 50.0000 mg | DELAYED_RELEASE_TABLET | Freq: Two times a day (BID) | ORAL | 0 refills | Status: DC | PRN

## 2022-11-02 NOTE — Telephone Encounter (Signed)
Talked to pt Husband, informed him I sent medicaiton in.

## 2022-12-08 ENCOUNTER — Encounter: Payer: Self-pay | Admitting: Family Medicine

## 2022-12-08 ENCOUNTER — Ambulatory Visit (INDEPENDENT_AMBULATORY_CARE_PROVIDER_SITE_OTHER): Payer: PPO | Admitting: Family Medicine

## 2022-12-08 VITALS — BP 124/76 | HR 76 | Ht 66.0 in | Wt 158.0 lb

## 2022-12-08 DIAGNOSIS — J0100 Acute maxillary sinusitis, unspecified: Secondary | ICD-10-CM | POA: Diagnosis not present

## 2022-12-08 DIAGNOSIS — R051 Acute cough: Secondary | ICD-10-CM | POA: Diagnosis not present

## 2022-12-08 MED ORDER — AZITHROMYCIN 250 MG PO TABS: ORAL_TABLET | ORAL | 0 refills | Status: AC

## 2022-12-08 MED ORDER — GUAIFENESIN-CODEINE 100-10 MG/5ML PO SYRP: 5.0000 mL | ORAL_SOLUTION | Freq: Three times a day (TID) | ORAL | 0 refills | Status: DC | PRN

## 2022-12-08 NOTE — Progress Notes (Signed)
Date:  12/08/2022   Name:  Heidi Bauer   DOB:  1945-10-06   MRN:  [id number]   Chief Complaint: Sinusitis (Cheek pressure under eyes, drainage gets worse at night- started 4 days ago. No color to production)  Sinusitis The current episode started in the past 7 days (monday). The problem has been waxing and waning since onset. There has been no fever. The pain is moderate. Associated symptoms include congestion, coughing, a hoarse voice and sinus pressure. Pertinent negatives include no chills, diaphoresis, ear pain, shortness of breath, sneezing or sore throat. Treatments tried: allergic. The treatment provided no relief.    Lab Results  Component Value Date   NA 145 (H) 05/29/2022   K 5.3 (H) 05/29/2022   CO2 26 05/29/2022   GLUCOSE 97 05/29/2022   BUN 18 05/29/2022   CREATININE 1.04 (H) 05/29/2022   CALCIUM 10.0 05/29/2022   EGFR 56 (L) 05/29/2022   Lab Results  Component Value Date   CHOL 248 (H) 05/29/2022   HDL 67 05/29/2022   LDLCALC 163 (H) 05/29/2022   TRIG 105 05/29/2022   No results found for: "TSH" No results found for: "HGBA1C" No results found for: "WBC", "HGB", "HCT", "MCV", "PLT" No results found for: "ALT", "AST", "GGT", "ALKPHOS", "BILITOT" No results found for: "25OHVITD2", "25OHVITD3", "VD25OH"   Review of Systems  Constitutional:  Negative for chills and diaphoresis.  HENT:  Positive for congestion, hoarse voice and sinus pressure. Negative for ear pain, sneezing and sore throat.   Respiratory:  Positive for cough. Negative for chest tightness, shortness of breath, wheezing and stridor.   Cardiovascular:  Negative for palpitations and leg swelling.    Patient Active Problem List   Diagnosis Date Noted   Primary osteoarthritis of right knee 05/18/2022   Pes anserinus bursitis of right knee 05/18/2022    Allergies  Allergen Reactions   Sulfa Antibiotics     Past Surgical History:  Procedure Laterality Date   BREAST LUMPECTOMY     KIDNEY  SURGERY     VAGINAL HYSTERECTOMY      Social History   Tobacco Use   Smoking status: Former    Packs/day: 0.25    Years: 15.00    Additional pack years: 0.00    Total pack years: 3.75    Types: Cigarettes    Quit date: 2004    Years since quitting: 20.2   Smokeless tobacco: Never   Tobacco comments:    smoking cessation materials not required  Vaping Use   Vaping Use: Never used  Substance Use Topics   Alcohol use: Yes    Alcohol/week: 1.0 standard drink of alcohol    Types: 1 Glasses of wine per week    Comment: occasionally   Drug use: No     Medication list has been reviewed and updated.  Current Meds  Medication Sig   Calcium Carbonate-Vitamin D (CALCIUM 500 + D) 500-125 MG-UNIT TABS Take by mouth.   diclofenac (VOLTAREN) 50 MG EC tablet Take 1 tablet (50 mg total) by mouth 2 (two) times daily as needed.       12/08/2022    2:31 PM 05/18/2022    2:25 PM 05/09/2022    2:30 PM 05/30/2021    3:23 PM  GAD 7 : Generalized Anxiety Score  Nervous, Anxious, on Edge 0 0 0 0  Control/stop worrying 0 0 0 0  Worry too much - different things 0 0 0 0  Trouble relaxing 0 0  0 0  Restless 0 0 0 0  Easily annoyed or irritable 0 0 0 0  Afraid - awful might happen 0 0 0 0  Total GAD 7 Score 0 0 0 0  Anxiety Difficulty Not difficult at all Not difficult at all Not difficult at all        12/08/2022    2:30 PM 05/18/2022    2:25 PM 05/09/2022    2:30 PM  Depression screen PHQ 2/9  Decreased Interest 0 0 0  Down, Depressed, Hopeless 0 0 0  PHQ - 2 Score 0 0 0  Altered sleeping 0 0 0  Tired, decreased energy 0 0 0  Change in appetite 0 0 0  Feeling bad or failure about yourself  0 0 0  Trouble concentrating 0 0 0  Moving slowly or fidgety/restless 0 0 0  Suicidal thoughts 0 0 0  PHQ-9 Score 0 0 0  Difficult doing work/chores Not difficult at all Not difficult at all Not difficult at all    BP Readings from Last 3 Encounters:  12/08/22 124/76  07/03/22 122/82   06/29/22 126/86    Physical Exam HENT:     Head: Normocephalic.     Right Ear: Tympanic membrane, ear canal and external ear normal.     Left Ear: Tympanic membrane, ear canal and external ear normal.     Nose: Nose normal.     Mouth/Throat:     Mouth: Mucous membranes are moist.  Eyes:     Conjunctiva/sclera: Conjunctivae normal.  Cardiovascular:     Heart sounds: No murmur heard.    No friction rub. No gallop.  Pulmonary:     Breath sounds: No wheezing, rhonchi or rales.  Abdominal:     Tenderness: There is no abdominal tenderness. There is no guarding.  Musculoskeletal:     Cervical back: Neck supple.  Neurological:     Mental Status: She is alert.     Wt Readings from Last 3 Encounters:  12/08/22 158 lb (71.7 kg)  07/03/22 155 lb (70.3 kg)  06/29/22 155 lb (70.3 kg)    BP 124/76   Pulse 76   Ht 5\' 6"  (1.676 m)   Wt 158 lb (71.7 kg)   SpO2 97%   BMI 25.50 kg/m   Assessment and Plan:  1. Acute maxillary sinusitis, recurrence not specified New onset.  Persistent.  Stable.  History of facial discomfort and purulent nasal discharge with tenderness noted over the maxillary sinuses bilateral is consistent with bilateral maxillary sinusitis.  Will treat with azithromycin to 50 mg 2 today followed by 1 a day for 4 days.  I have also suggested nasal saline/nasal steroid/mucolytic agent/and Sudafed 30 mg to further open up sinus passages. - azithromycin (ZITHROMAX) 250 MG tablet; Take 2 tablets on day 1, then 1 tablet daily on days 2 through 5  Dispense: 6 tablet; Refill: 0  2. Acute cough Suggestion of Mucinex DM during the day but we will use some Robitussin AC at night so we can have comfortable curtailing of cough. - guaiFENesin-codeine (ROBITUSSIN AC) 100-10 MG/5ML syrup; Take 5 mLs by mouth 3 (three) times daily as needed for cough.  Dispense: 118 mL; Refill: 0    Otilio Miu, MD

## 2022-12-09 IMAGING — CR DG KNEE COMPLETE 4+V*R*
4 series · 5 of 5 positions shown · non-contrast
Comparison: None.

CLINICAL DATA: Right knee pain.

EXAM:
RIGHT KNEE - COMPLETE 4+ VIEW

[knee ap]
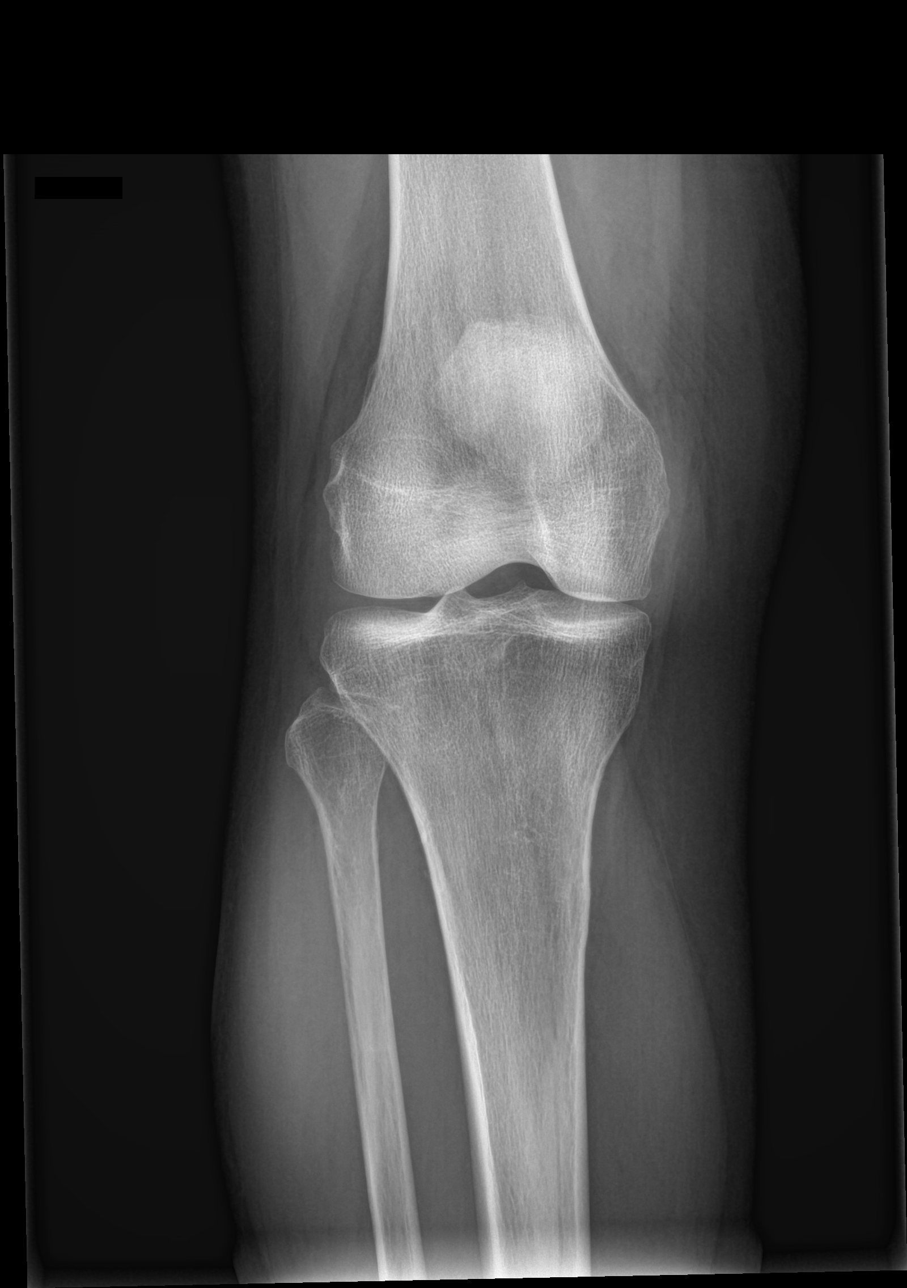

[knee lat]
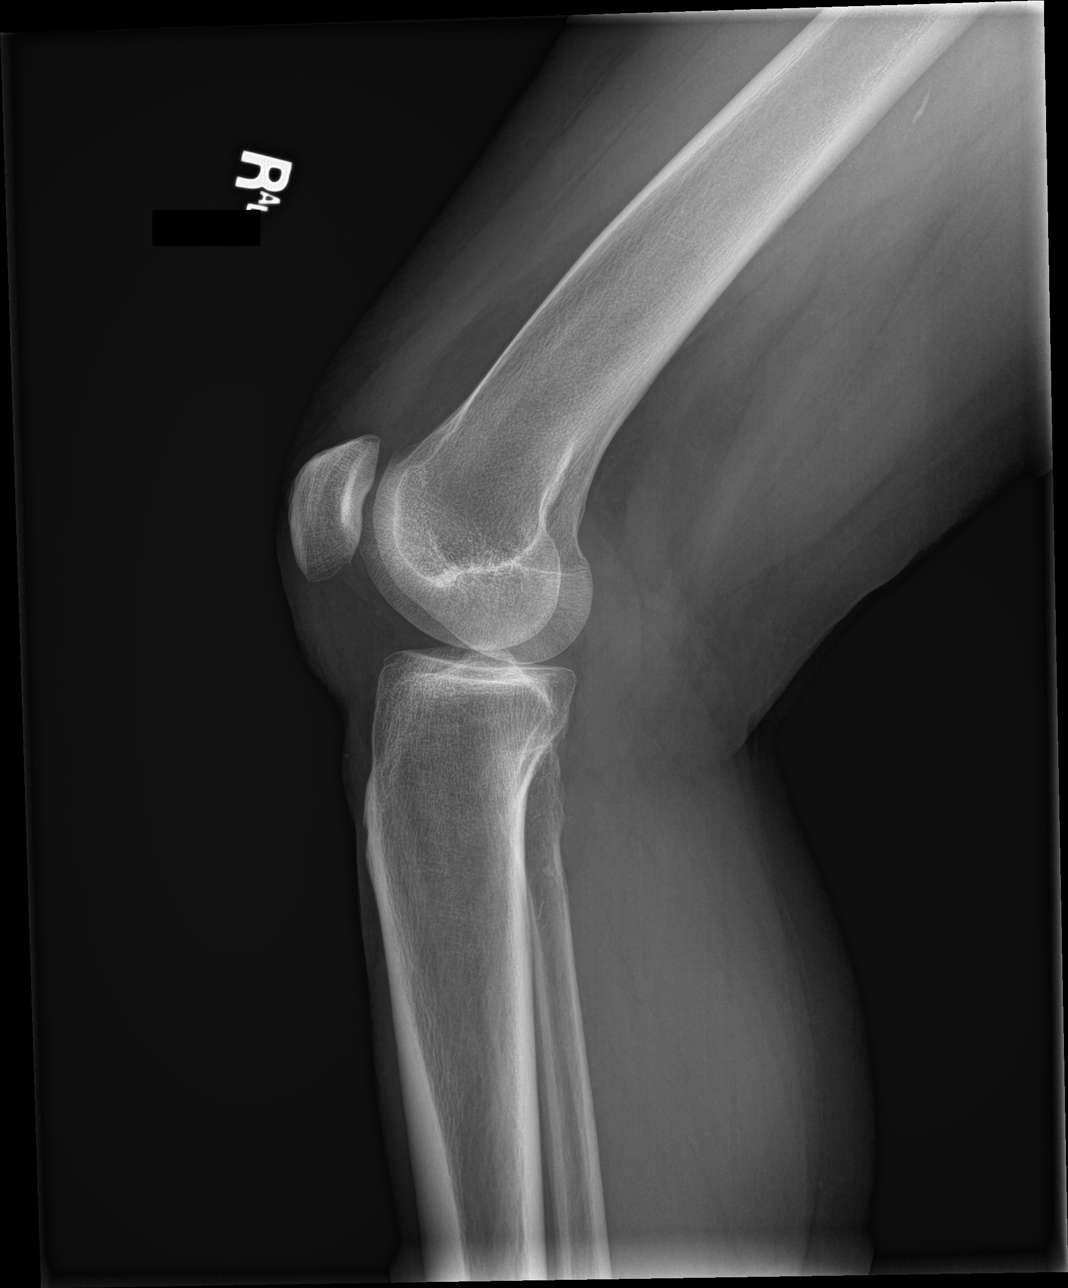

[tunnel]
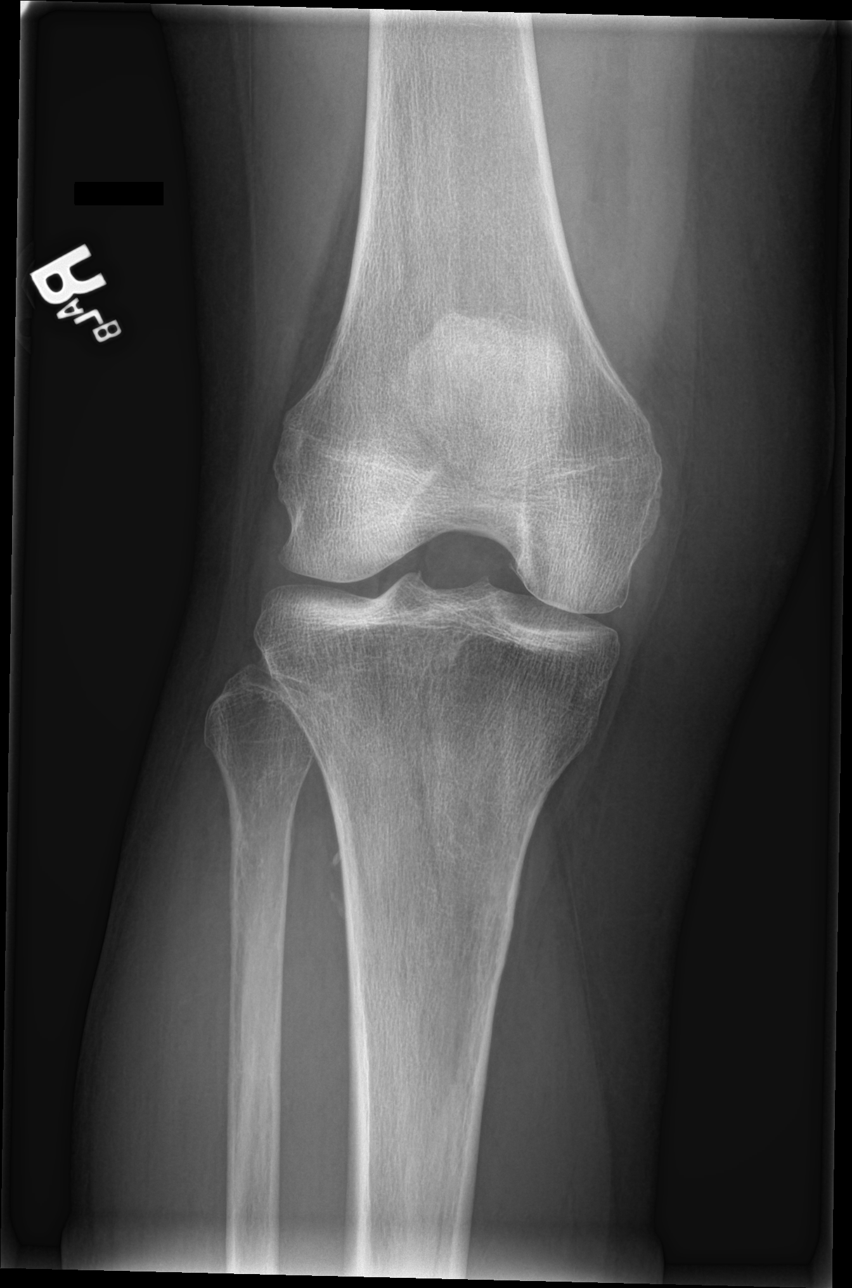

[Series 4: patella skyline · 0.14mm/px · 2 of 2 slices shown]
[im 1/2]
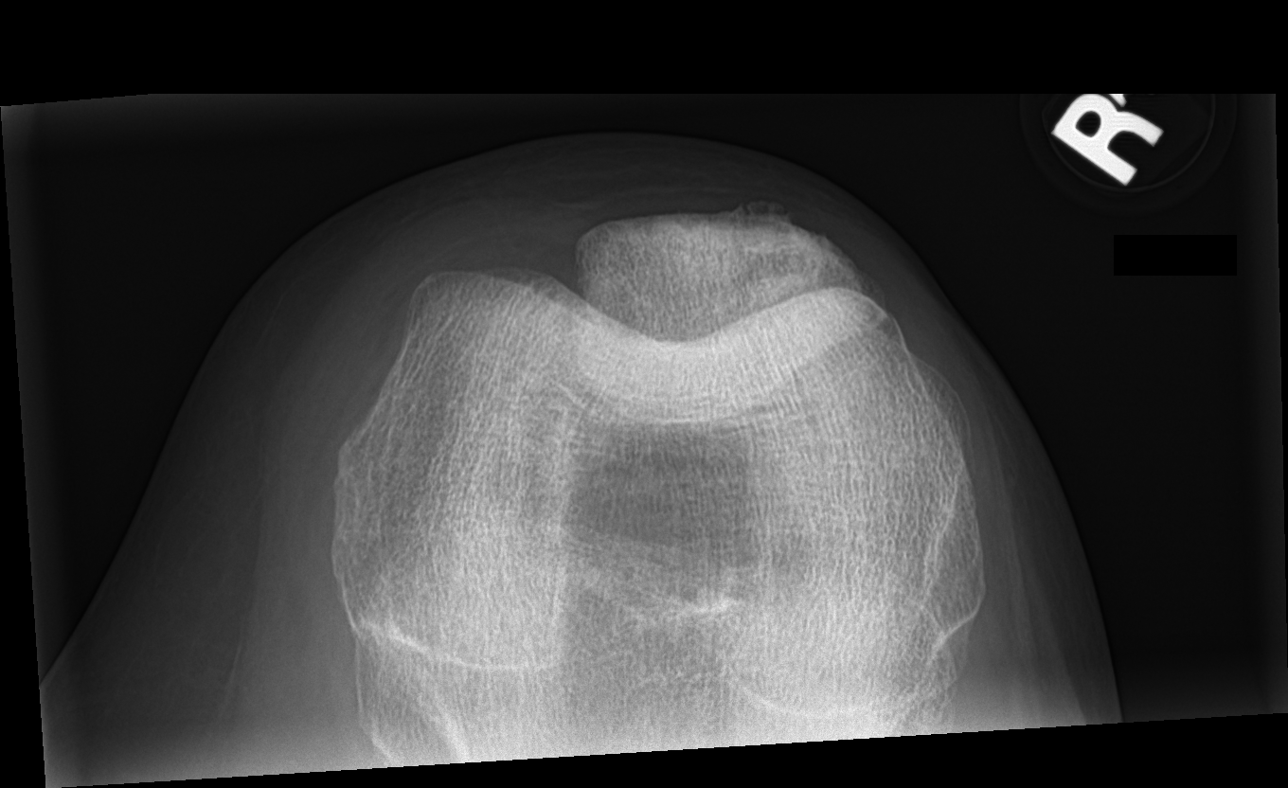
[im 2/2]
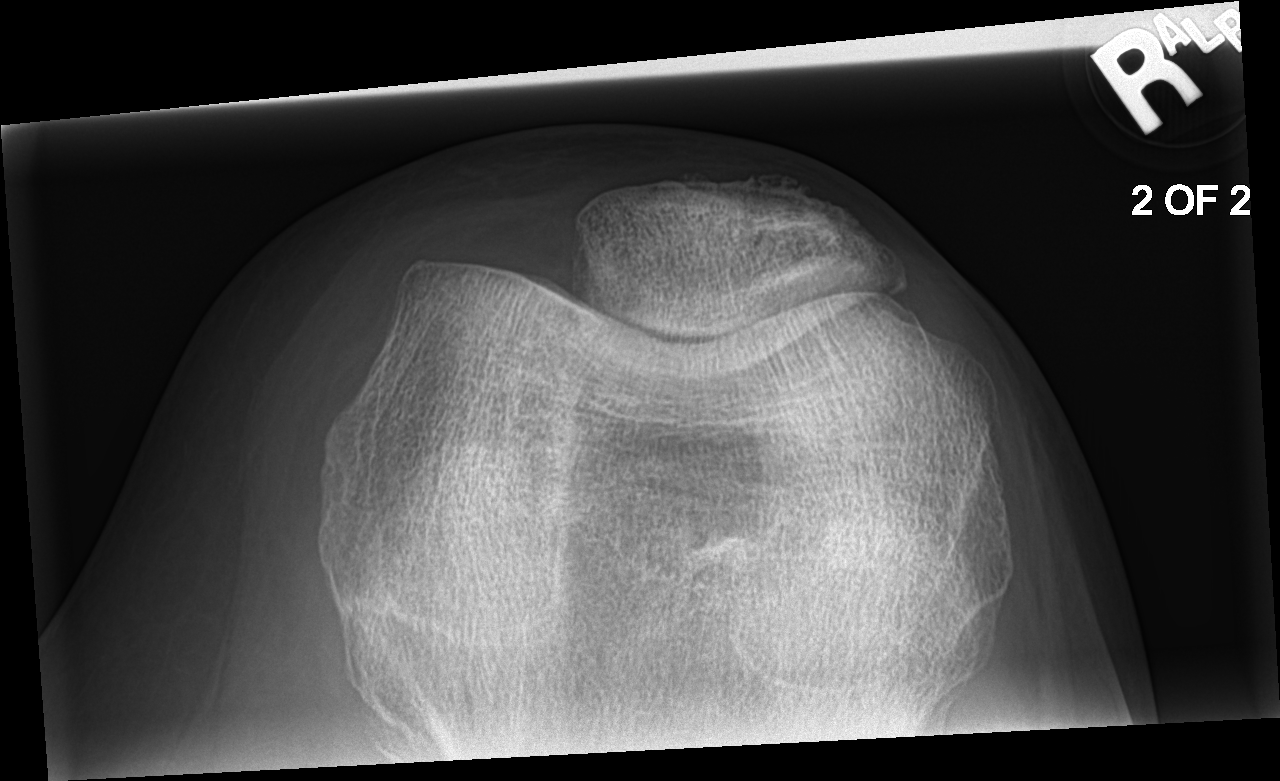

[5 of 5 positions shown; findings below may reference images not displayed]

FINDINGS: There is no acute fracture or dislocation. The bones are mildly
osteopenic. No joint effusion. The soft tissues are unremarkable.
IMPRESSION: Negative.

## 2022-12-27 ENCOUNTER — Ambulatory Visit (INDEPENDENT_AMBULATORY_CARE_PROVIDER_SITE_OTHER): Payer: PPO

## 2022-12-27 VITALS — Ht 66.0 in | Wt 158.0 lb

## 2022-12-27 DIAGNOSIS — Z0000 Encounter for general adult medical examination without abnormal findings: Secondary | ICD-10-CM | POA: Diagnosis not present

## 2022-12-27 NOTE — Patient Instructions (Signed)
Heidi Bauer , Thank you for taking time to come for your Medicare Wellness Visit. I appreciate your ongoing commitment to your health goals. Please review the following plan we discussed and let me know if I can assist you in the future.   These are the goals we discussed:  Goals      DIET - EAT MORE FRUITS AND VEGETABLES     DIET - INCREASE WATER INTAKE     Recommend to drink at least 6-8 8oz glasses of water per day.        This is a list of the screening recommended for you and due dates:  Health Maintenance  Topic Date Due   COVID-19 Vaccine (6 - 2023-24 season) 08/06/2022   Zoster (Shingles) Vaccine (1 of 2) 03/10/2023*   Pneumonia Vaccine (1 of 1 - PCV) 05/10/2023*   Hepatitis C Screening: USPSTF Recommendation to screen - Ages 18-79 yo.  05/10/2023*   Stool Blood Test  06/10/2023*   Medicare Annual Wellness Visit  12/27/2023   DEXA scan (bone density measurement)  Completed   HPV Vaccine  Aged Out   DTaP/Tdap/Td vaccine  Discontinued   Flu Shot  Discontinued   Colon Cancer Screening  Discontinued  *Topic was postponed. The date shown is not the original due date.    Advanced directives: NO  Conditions/risks identified: NONE  Next appointment: Follow up in one year for your annual wellness visit 01/02/24 @ 3:30 PM BY PHONE   Preventive Care 65 Years and Older, Female Preventive care refers to lifestyle choices and visits with your health care provider that can promote health and wellness. What does preventive care include? A yearly physical exam. This is also called an annual well check. Dental exams once or twice a year. Routine eye exams. Ask your health care provider how often you should have your eyes checked. Personal lifestyle choices, including: Daily care of your teeth and gums. Regular physical activity. Eating a healthy diet. Avoiding tobacco and drug use. Limiting alcohol use. Practicing safe sex. Taking low-dose aspirin every day. Taking vitamin and  mineral supplements as recommended by your health care provider. What happens during an annual well check? The services and screenings done by your health care provider during your annual well check will depend on your age, overall health, lifestyle risk factors, and family history of disease. Counseling  Your health care provider may ask you questions about your: Alcohol use. Tobacco use. Drug use. Emotional well-being. Home and relationship well-being. Sexual activity. Eating habits. History of falls. Memory and ability to understand (cognition). Work and work Astronomer. Reproductive health. Screening  You may have the following tests or measurements: Height, weight, and BMI. Blood pressure. Lipid and cholesterol levels. These may be checked every 5 years, or more frequently if you are over 84 years old. Skin check. Lung cancer screening. You may have this screening every year starting at age 79 if you have a 30-pack-year history of smoking and currently smoke or have quit within the past 15 years. Fecal occult blood test (FOBT) of the stool. You may have this test every year starting at age 58. Flexible sigmoidoscopy or colonoscopy. You may have a sigmoidoscopy every 5 years or a colonoscopy every 10 years starting at age 58. Hepatitis C blood test. Hepatitis B blood test. Sexually transmitted disease (STD) testing. Diabetes screening. This is done by checking your blood sugar (glucose) after you have not eaten for a while (fasting). You may have this done every 1-3  years. Bone density scan. This is done to screen for osteoporosis. You may have this done starting at age 26. Mammogram. This may be done every 1-2 years. Talk to your health care provider about how often you should have regular mammograms. Talk with your health care provider about your test results, treatment options, and if necessary, the need for more tests. Vaccines  Your health care provider may recommend  certain vaccines, such as: Influenza vaccine. This is recommended every year. Tetanus, diphtheria, and acellular pertussis (Tdap, Td) vaccine. You may need a Td booster every 10 years. Zoster vaccine. You may need this after age 74. Pneumococcal 13-valent conjugate (PCV13) vaccine. One dose is recommended after age 58. Pneumococcal polysaccharide (PPSV23) vaccine. One dose is recommended after age 75. Talk to your health care provider about which screenings and vaccines you need and how often you need them. This information is not intended to replace advice given to you by your health care provider. Make sure you discuss any questions you have with your health care provider. Document Released: 09/24/2015 Document Revised: 05/17/2016 Document Reviewed: 06/29/2015 Elsevier Interactive Patient Education  2017 [place] Prevention in the Home Falls can cause injuries. They can happen to people of all ages. There are many things you can do to make your home safe and to help prevent falls. What can I do on the outside of my home? Regularly fix the edges of walkways and driveways and fix any cracks. Remove anything that might make you trip as you walk through a door, such as a raised step or threshold. Trim any bushes or trees on the path to your home. Use bright outdoor lighting. Clear any walking paths of anything that might make someone trip, such as rocks or tools. Regularly check to see if handrails are loose or broken. Make sure that both sides of any steps have handrails. Any raised decks and porches should have guardrails on the edges. Have any leaves, snow, or ice cleared regularly. Use sand or salt on walking paths during winter. Clean up any spills in your garage right away. This includes oil or grease spills. What can I do in the bathroom? Use night lights. Install grab bars by the toilet and in the tub and shower. Do not use towel bars as grab bars. Use non-skid mats or  decals in the tub or shower. If you need to sit down in the shower, use a plastic, non-slip stool. Keep the floor dry. Clean up any water that spills on the floor as soon as it happens. Remove soap buildup in the tub or shower regularly. Attach bath mats securely with double-sided non-slip rug tape. Do not have throw rugs and other things on the floor that can make you trip. What can I do in the bedroom? Use night lights. Make sure that you have a light by your bed that is easy to reach. Do not use any sheets or blankets that are too big for your bed. They should not hang down onto the floor. Have a firm chair that has side arms. You can use this for support while you get dressed. Do not have throw rugs and other things on the floor that can make you trip. What can I do in the kitchen? Clean up any spills right away. Avoid walking on wet floors. Keep items that you use a lot in easy-to-reach places. If you need to reach something above you, use a strong step stool that has a grab  bar. Keep electrical cords out of the way. Do not use floor polish or wax that makes floors slippery. If you must use wax, use non-skid floor wax. Do not have throw rugs and other things on the floor that can make you trip. What can I do with my stairs? Do not leave any items on the stairs. Make sure that there are handrails on both sides of the stairs and use them. Fix handrails that are broken or loose. Make sure that handrails are as long as the stairways. Check any carpeting to make sure that it is firmly attached to the stairs. Fix any carpet that is loose or worn. Avoid having throw rugs at the top or bottom of the stairs. If you do have throw rugs, attach them to the floor with carpet tape. Make sure that you have a light switch at the top of the stairs and the bottom of the stairs. If you do not have them, ask someone to add them for you. What else can I do to help prevent falls? Wear shoes that: Do not  have high heels. Have rubber bottoms. Are comfortable and fit you well. Are closed at the toe. Do not wear sandals. If you use a stepladder: Make sure that it is fully opened. Do not climb a closed stepladder. Make sure that both sides of the stepladder are locked into place. Ask someone to hold it for you, if possible. Clearly mark and make sure that you can see: Any grab bars or handrails. First and last steps. Where the edge of each step is. Use tools that help you move around (mobility aids) if they are needed. These include: Canes. Walkers. Scooters. Crutches. Turn on the lights when you go into a dark area. Replace any light bulbs as soon as they burn out. Set up your furniture so you have a clear path. Avoid moving your furniture around. If any of your floors are uneven, fix them. If there are any pets around you, be aware of where they are. Review your medicines with your doctor. Some medicines can make you feel dizzy. This can increase your chance of falling. Ask your doctor what other things that you can do to help prevent falls. This information is not intended to replace advice given to you by your health care provider. Make sure you discuss any questions you have with your health care provider. Document Released: 06/24/2009 Document Revised: 02/03/2016 Document Reviewed: 10/02/2014 Elsevier Interactive Patient Education  2017 [employer].

## 2022-12-27 NOTE — Progress Notes (Signed)
I connected with  Lonell Grandchild on 12/27/22 by a audio enabled telemedicine application and verified that I am speaking with the correct person using two identifiers.  Patient Location: Home  Provider Location: Office/Clinic  I discussed the limitations of evaluation and management by telemedicine. The patient expressed understanding and agreed to proceed.  Subjective:   Heidi Bauer is a 77 y.o. female who presents for Medicare Annual (Subsequent) preventive examination.  Review of Systems     Cardiac Risk Factors include: advanced age (>57men, >88 women)     Objective:    There were no vitals filed for this visit. There is no height or weight on file to calculate BMI.     12/27/2022    3:31 PM 12/12/2021    3:39 PM 04/28/2020    1:28 PM 04/24/2018    1:29 PM 04/23/2017   11:04 AM 07/21/2015    9:58 AM 04/09/2015    2:02 PM  Advanced Directives  Does Patient Have a Medical Advance Directive? No Yes Yes Yes Yes Yes Yes  Type of Furniture conservator/restorer;Living will Healthcare Power of [place];Living will Healthcare Power of [place];Living will Healthcare Power of [place];Living will Healthcare Power of [place];Living will Living will  Copy of Healthcare Power of Attorney in Chart?  No - copy requested No - copy requested No - copy requested No - copy requested No - copy requested No - copy requested  Would patient like information on creating a medical advance directive? No - Patient declined          Current Medications (verified) Outpatient Encounter Medications as of 12/27/2022  Medication Sig   Calcium Carbonate-Vitamin D (CALCIUM 500 + D) 500-125 MG-UNIT TABS Take by mouth.   diclofenac (VOLTAREN) 50 MG EC tablet Take 1 tablet (50 mg total) by mouth 2 (two) times daily as needed.   guaiFENesin-codeine (ROBITUSSIN AC) 100-10 MG/5ML syrup Take 5 mLs by mouth 3 (three) times daily as needed for cough.   No facility-administered encounter medications on  file as of 12/27/2022.    Allergies (verified) Sulfa antibiotics   History: Past Medical History:  Diagnosis Date   Interstitial cystitis    Past Surgical History:  Procedure Laterality Date   BREAST LUMPECTOMY     KIDNEY SURGERY     VAGINAL HYSTERECTOMY     Family History  Problem Relation Age of Onset   Hypertension Mother    Social History   Socioeconomic History   Marital status: Married    Spouse name: Not on file   Number of children: 2   Years of education: Not on file   Highest education level: Associate degree: academic program  Occupational History   Occupation: Retired  Tobacco Use   Smoking status: Former    Packs/day: 0.25    Years: 15.00    Additional pack years: 0.00    Total pack years: 3.75    Types: Cigarettes    Quit date: 2004    Years since quitting: 20.3   Smokeless tobacco: Never   Tobacco comments:    smoking cessation materials not required  Vaping Use   Vaping Use: Never used  Substance and Sexual Activity   Alcohol use: Yes    Alcohol/week: 1.0 standard drink of alcohol    Types: 1 Glasses of wine per week    Comment: occasionally   Drug use: No   Sexual activity: Yes  Other Topics Concern   Not on file  Social  History Narrative   Not on file   Social Determinants of Health   Financial Resource Strain: Low Risk  (12/27/2022)   Overall Financial Resource Strain (CARDIA)    Difficulty of Paying Living Expenses: Not hard at all  Food Insecurity: No Food Insecurity (12/27/2022)   Hunger Vital Sign    Worried About Running Out of Food in the Last Year: Never true    Ran Out of Food in the Last Year: Never true  Transportation Needs: No Transportation Needs (12/27/2022)   PRAPARE - Administrator, Civil Service (Medical): No    Lack of Transportation (Non-Medical): No  Physical Activity: Sufficiently Active (12/27/2022)   Exercise Vital Sign    Days of Exercise per Week: 7 days    Minutes of Exercise per Session: 30  min  Stress: No Stress Concern Present (12/27/2022)   [employer] of Occupational Health - Occupational Stress Questionnaire    Feeling of Stress : Not at all  Social Connections: Moderately Integrated (12/27/2022)   Social Connection and Isolation Panel [NHANES]    Frequency of Communication with Friends and Family: More than three times a week    Frequency of Social Gatherings with Friends and Family: Three times a week    Attends Religious Services: Never    Active Member of Clubs or Organizations: Yes    Attends Engineer, structural: More than 4 times per year    Marital Status: Married    Tobacco Counseling Counseling given: Not Answered Tobacco comments: smoking cessation materials not required   Clinical Intake:  Pre-visit preparation completed: Yes  Pain : No/denies pain     Diabetes: No  How often do you need to have someone help you when you read instructions, pamphlets, or other written materials from your doctor or pharmacy?: 1 - Never  Diabetic?NO  Interpreter Needed?: No  Information entered by :: Kennedy Bucker, LPN   Activities of Daily Living    12/27/2022    3:32 PM  In your present state of health, do you have any difficulty performing the following activities:  Hearing? 0  Vision? 0  Difficulty concentrating or making decisions? 0  Walking or climbing stairs? 0  Dressing or bathing? 0  Doing errands, shopping? 0  Preparing Food and eating ? N  Using the Toilet? N  In the past six months, have you accidently leaked urine? N  Do you have problems with loss of bowel control? N  Managing your Medications? N  Managing your Finances? N  Housekeeping or managing your Housekeeping? N    Patient Care Team: Duanne Limerick, MD as PCP - General (Family Medicine)  Indicate any recent Medical Services you may have received from other than [facility] providers in the past year (date may be approximate).     Assessment:   This is a routine  wellness examination for [facility].  Hearing/Vision screen Hearing Screening - Comments:: NO AIDS Vision Screening - Comments:: WEARS GLASSES- DR.DINGELDEIN  Dietary issues and exercise activities discussed: Current Exercise Habits: Home exercise routine, Type of exercise: walking, Time (Minutes): 30, Frequency (Times/Week): 7, Weekly Exercise (Minutes/Week): 210, Intensity: Mild   Goals Addressed             This Visit's Progress    DIET - EAT MORE FRUITS AND VEGETABLES         Depression Screen    12/27/2022    3:29 PM 12/08/2022    2:30 PM 05/18/2022    2:25  PM 05/09/2022    2:30 PM 12/12/2021    3:38 PM 05/30/2021    3:23 PM 09/06/2020    9:55 AM  PHQ 2/9 Scores  PHQ - 2 Score 0 0 0 0 0 0 0  PHQ- 9 Score 0 0 0 0  0 0    Fall Risk    12/27/2022    3:32 PM 12/08/2022    2:30 PM 05/18/2022    2:25 PM 05/09/2022    2:30 PM 12/12/2021    3:40 PM  Fall Risk   Falls in the past year? 0 0 0 0 0  Number falls in past yr: 0 0 0 0 0  Injury with Fall? 0 0 0 0 0  Risk for fall due to : No Fall Risks No Fall Risks  No Fall Risks No Fall Risks  Follow up Falls prevention discussed;Falls evaluation completed Falls evaluation completed  Falls evaluation completed Falls prevention discussed    FALL RISK PREVENTION PERTAINING TO THE HOME:  Any stairs in or around the home? Yes  If so, are there any without handrails? No  Home free of loose throw rugs in walkways, pet beds, electrical cords, etc? Yes  Adequate lighting in your home to reduce risk of falls? Yes   ASSISTIVE DEVICES UTILIZED TO PREVENT FALLS:  Life alert? No  Use of a cane, walker or w/c? No  Grab bars in the bathroom? No  Shower chair or bench in shower? No  Elevated toilet seat or a handicapped toilet? No    Cognitive Function:        12/27/2022    3:34 PM 04/24/2018    1:31 PM 04/23/2017   11:04 AM  6CIT Screen  What Year? 0 points 0 points 0 points  What month? 0 points 0 points 0 points  What time? 0 points 0  points 0 points  Count back from 20 0 points 0 points 0 points  Months in reverse 0 points 0 points 0 points  Repeat phrase 2 points 0 points 0 points  Total Score 2 points 0 points 0 points    Immunizations Immunization History  Administered Date(s) Administered   PFIZER(Purple Top)SARS-COV-2 Vaccination 09/29/2019, 10/20/2019, 06/25/2020, 06/20/2021, 06/11/2022    TDAP status: Due, Education has been provided regarding the importance of this vaccine. Advised may receive this vaccine at local pharmacy or Health Dept. Aware to provide a copy of the vaccination record if obtained from local pharmacy or Health Dept. Verbalized acceptance and understanding.  Flu Vaccine status: Declined, Education has been provided regarding the importance of this vaccine but patient still declined. Advised may receive this vaccine at local pharmacy or Health Dept. Aware to provide a copy of the vaccination record if obtained from local pharmacy or Health Dept. Verbalized acceptance and understanding.  Pneumococcal vaccine status: Declined,  Education has been provided regarding the importance of this vaccine but patient still declined. Advised may receive this vaccine at local pharmacy or Health Dept. Aware to provide a copy of the vaccination record if obtained from local pharmacy or Health Dept. Verbalized acceptance and understanding.   Covid-19 vaccine status: Completed vaccines  Qualifies for Shingles Vaccine? Yes   Zostavax completed No   Shingrix Completed?: No.    Education has been provided regarding the importance of this vaccine. Patient has been advised to call insurance company to determine out of pocket expense if they have not yet received this vaccine. Advised may also receive vaccine at local pharmacy or  Health Dept. Verbalized acceptance and understanding.  Screening Tests Health Maintenance  Topic Date Due   COVID-19 Vaccine (6 - 2023-24 season) 08/06/2022   Zoster Vaccines- Shingrix (1  of 2) 03/10/2023 (Originally 05/06/1997)   Pneumonia Vaccine 31+ Years old (1 of 1 - PCV) 05/10/2023 (Originally 05/06/2012)   Hepatitis C Screening  05/10/2023 (Originally 05/06/1964)   COLON CANCER SCREENING ANNUAL FOBT  06/10/2023 (Originally 06/24/2022)   Medicare Annual Wellness (AWV)  12/27/2023   DEXA SCAN  Completed   HPV VACCINES  Aged Out   DTaP/Tdap/Td  Discontinued   INFLUENZA VACCINE  Discontinued   COLONOSCOPY (Pts 45-109yrs Insurance coverage will need to be confirmed)  Discontinued    Health Maintenance  Health Maintenance Due  Topic Date Due   COVID-19 Vaccine (6 - 2023-24 season) 08/06/2022    Colorectal cancer screening: No longer required.   Mammogram status: No longer required due to AGE.  Bone Density status: Completed 01/19/20. Results reflect: Bone density results: NORMAL. Repeat every 5 years.  Lung Cancer Screening: (Low Dose CT Chest recommended if Age 42-80 years, 30 pack-year currently smoking OR have quit w/in 15years.) does not qualify.    Additional Screening:  Hepatitis C Screening: does qualify; Completed NO  Vision Screening: Recommended annual ophthalmology exams for early detection of glaucoma and other disorders of the eye. Is the patient up to date with their annual eye exam?  Yes  Who is the provider or what is the name of the office in which the patient attends annual eye exams? DR.DINGELDEIN If pt is not established with a provider, would they like to be referred to a provider to establish care? No .   Dental Screening: Recommended annual dental exams for proper oral hygiene  Community Resource Referral / Chronic Care Management: CRR required this visit?  No   CCM required this visit?  No      Plan:     I have personally reviewed and noted the following in the patient's chart:   Medical and social history Use of alcohol, tobacco or illicit drugs  Current medications and supplements including opioid prescriptions. Patient is not  currently taking opioid prescriptions. Functional ability and status Nutritional status Physical activity Advanced directives List of other physicians Hospitalizations, surgeries, and ER visits in previous 12 months Vitals Screenings to include cognitive, depression, and falls Referrals and appointments  In addition, I have reviewed and discussed with patient certain preventive protocols, quality metrics, and best practice recommendations. A written personalized care plan for preventive services as well as general preventive health recommendations were provided to patient.     Hal Hope, LPN   [id number]   Nurse Notes: [facility]

## 2023-01-29 ENCOUNTER — Other Ambulatory Visit: Payer: Self-pay | Admitting: Family Medicine

## 2023-01-29 DIAGNOSIS — M7051 Other bursitis of knee, right knee: Secondary | ICD-10-CM

## 2023-01-29 DIAGNOSIS — M1711 Unilateral primary osteoarthritis, right knee: Secondary | ICD-10-CM

## 2023-01-30 NOTE — Telephone Encounter (Signed)
Requested medication (s) are due for refill today: yes  Requested medication (s) are on the active medication list: yes  Last refill:  11/02/22 #60/0  Future visit scheduled: no  Notes to clinic:  Unable to refill per protocol due to failed labs, no updated results.      Requested Prescriptions  Pending Prescriptions Disp Refills   diclofenac (VOLTAREN) 50 MG EC tablet [Pharmacy Med Name: DICLOFENAC SODIUM 50 MG DR TAB] 60 tablet 0    Sig: TAKE 1 TABLET BY MOUTH 2 TIMES DAILY AS NEEDED     Analgesics:  NSAIDS Failed - 01/29/2023  9:32 AM      Failed - Manual Review: Labs are only required if the patient has taken medication for more than 8 weeks.      Failed - Cr in normal range and within 360 days    Creatinine, Ser  Date Value Ref Range Status  05/29/2022 1.04 (H) 0.57 - 1.00 mg/dL Final         Failed - HGB in normal range and within 360 days    No results found for: "HGB", "HGBKUC", "HGBPOCKUC", "HGBOTHER", "TOTHGB", "HGBPLASMA"       Failed - PLT in normal range and within 360 days    No results found for: "PLT", "PLTCOUNTKUC", "LABPLAT", "POCPLA"       Failed - HCT in normal range and within 360 days    No results found for: "HCT", "HCTKUC", "SRHCT"       Passed - eGFR is 30 or above and within 360 days    eGFR  Date Value Ref Range Status  05/29/2022 56 (L) >59 mL/min/1.73 Final         Passed - Patient is not pregnant      Passed - Valid encounter within last 12 months    Recent Outpatient Visits           1 month ago Acute maxillary sinusitis, recurrence not specified   [facility] In[facility], [facility]   7 months ago Primary osteoarthritis of right knee   [facility], Ocie Bob, MD   7 months ago Primary osteoarthritis of right knee   [facility], Ocie Bob, MD   8 months ago Primary  osteoarthritis of right knee   [facility], Ocie Bob, MD   8 months ago Chronic pain of right knee   [facility] Inches, MD

## 2023-01-31 ENCOUNTER — Telehealth: Payer: Self-pay | Admitting: Family Medicine

## 2023-01-31 DIAGNOSIS — M1711 Unilateral primary osteoarthritis, right knee: Secondary | ICD-10-CM

## 2023-01-31 DIAGNOSIS — M7051 Other bursitis of knee, right knee: Secondary | ICD-10-CM

## 2023-01-31 NOTE — Telephone Encounter (Signed)
Medication Refill - Medication: diclofenac (VOLTAREN) 50 MG EC tablet [[id number]]   Has the patient contacted their pharmacy? Yes.    Preferred Pharmacy (with phone number or street name): [facility] - [place], [place] - [address] [place], [place] [address] Phone: [phone]  Fax: [phone] DEA #: --  DAW Reason: --    Has the patient been seen for an appointment in the last year OR does the patient have an upcoming appointment? Yes.    Agent: Please be advised that RX refills may take up to 3 business days. We ask that you follow-up with your pharmacy.

## 2023-01-31 NOTE — Telephone Encounter (Signed)
Sent today in another encounter.   E-Prescribing Status: Receipt confirmed by pharmacy (01/31/2023 11:48 AM EDT)

## 2023-08-13 DIAGNOSIS — H2513 Age-related nuclear cataract, bilateral: Secondary | ICD-10-CM | POA: Diagnosis not present

## 2024-01-02 ENCOUNTER — Ambulatory Visit: Payer: PPO | Admitting: Emergency Medicine

## 2024-01-02 VITALS — Ht 65.0 in | Wt 155.0 lb

## 2024-01-02 DIAGNOSIS — Z0000 Encounter for general adult medical examination without abnormal findings: Secondary | ICD-10-CM | POA: Diagnosis not present

## 2024-01-02 NOTE — Progress Notes (Signed)
 Subjective:   Heidi Bauer is a 78 y.o. who presents for a Medicare Wellness preventive visit.  Visit Complete: Virtual I connected with  Renie Carver on 01/02/24 by a audio enabled telemedicine application and verified that I am speaking with the correct person using two identifiers.  Patient Location: Home  Provider Location: Home Office  I discussed the limitations of evaluation and management by telemedicine. The patient expressed understanding and agreed to proceed.  Vital Signs: Because this visit was a virtual/telehealth visit, some criteria may be missing or patient reported. Any vitals not documented were not able to be obtained and vitals that have been documented are patient reported.  VideoDeclined- This patient declined Librarian, academic. Therefore the visit was completed with audio only.  Persons Participating in Visit: Patient.  AWV Questionnaire: No: Patient Medicare AWV questionnaire was not completed prior to this visit.  Cardiac Risk Factors include: advanced age (>92men, >63 women)     Objective:    Today's Vitals   01/02/24 1515  Weight: 155 lb (70.3 kg)  Height: 5\' 5"  (1.651 m)   Body mass index is 25.79 kg/m.     01/02/2024    3:25 PM 12/27/2022    3:31 PM 12/12/2021    3:39 PM 04/28/2020    1:28 PM 04/24/2018    1:29 PM 04/23/2017   11:04 AM 07/21/2015    9:58 AM  Advanced Directives  Does Patient Have a Medical Advance Directive? Yes No Yes Yes Yes Yes Yes  Type of Estate agent of [place];Living will  Healthcare Power of [place];Living will Healthcare Power of [place];Living will Healthcare Power of [place];Living will Healthcare Power of [place];Living will Healthcare Power of [place];Living will  Does patient want to make changes to medical advance directive? No - Patient declined        Copy of Healthcare Power of Attorney in Chart? No - copy requested  No - copy requested No - copy requested  No - copy requested No - copy requested No - copy requested  Would patient like information on creating a medical advance directive?  No - Patient declined         Current Medications (verified) Outpatient Encounter Medications as of 01/02/2024  Medication Sig   Calcium Carbonate-Vitamin D  (CALCIUM 500 + D) 500-125 MG-UNIT TABS Take by mouth.   diclofenac  (VOLTAREN ) 50 MG EC tablet TAKE 1 TABLET BY MOUTH 2 TIMES DAILY AS NEEDED   guaiFENesin -codeine  (ROBITUSSIN AC) 100-10 MG/5ML syrup Take 5 mLs by mouth 3 (three) times daily as needed for cough. (Patient not taking: Reported on 01/02/2024)   No facility-administered encounter medications on file as of 01/02/2024.    Allergies (verified) Sulfa antibiotics   History: Past Medical History:  Diagnosis Date   Interstitial cystitis    Past Surgical History:  Procedure Laterality Date   BREAST LUMPECTOMY     KIDNEY SURGERY     VAGINAL HYSTERECTOMY     Family History  Problem Relation Age of Onset   Hypertension Mother    Heart attack Mother 36   Other Father        unknown medical history   Social History   Socioeconomic History   Marital status: Married    Spouse name: Renelda Carry   Number of children: 2   Years of education: Not on file   Highest education level: Associate degree: academic program  Occupational History   Occupation: Retired  Tobacco Use   Smoking status:  Former    Current packs/day: 0.00    Average packs/day: 0.3 packs/day for 15.0 years (3.8 ttl pk-yrs)    Types: Cigarettes    Start date: 68    Quit date: 2004    Years since quitting: 21.3    Passive exposure: Past   Smokeless tobacco: Never   Tobacco comments:    smoking cessation materials not required  Vaping Use   Vaping status: Never Used  Substance and Sexual Activity   Alcohol use: Yes    Comment: 2 glasses of wine per month   Drug use: No   Sexual activity: Yes  Other Topics Concern   Not on file  Social History Narrative   01/02/24  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)   Social Drivers of Health   Financial Resource Strain: Low Risk  (01/02/2024)   Overall Financial Resource Strain (CARDIA)    Difficulty of Paying Living Expenses: Not hard at all  Food Insecurity: No Food Insecurity (01/02/2024)   Hunger Vital Sign    Worried About Running Out of Food in the Last Year: Never true    Ran Out of Food in the Last Year: Never true  Transportation Needs: No Transportation Needs (01/02/2024)   PRAPARE - Administrator, Civil Service (Medical): No    Lack of Transportation (Non-Medical): No  Physical Activity: Inactive (01/02/2024)   Exercise Vital Sign    Days of Exercise per Week: 0 days    Minutes of Exercise per Session: 0 min  Stress: No Stress Concern Present (01/02/2024)   [employer] of Occupational Health - Occupational Stress Questionnaire    Feeling of Stress : Not at all  Social Connections: Socially Integrated (01/02/2024)   Social Connection and Isolation Panel [NHANES]    Frequency of Communication with Friends and Family: More than three times a week    Frequency of Social Gatherings with Friends and Family: More than three times a week    Attends Religious Services: More than 4 times per year    Active Member of [employer] or Organizations: Yes    Attends Engineer, structural: More than 4 times per year    Marital Status: Married    Tobacco Counseling Counseling given: No Tobacco comments: smoking cessation materials not required    Clinical Intake:  Pre-visit preparation completed: Yes  Pain : No/denies pain     BMI - recorded: 25.79 Nutritional Status: BMI 25 -29 Overweight Nutritional Risks: None Diabetes: No  No results found for: "HGBA1C"   How often do you need to have someone help you when you read instructions, pamphlets, or other written materials from your doctor or pharmacy?: 1 - Never  Interpreter Needed?: No  Information  entered by :: Jaunita Messier, CMA   Activities of Daily Living     01/02/2024    3:17 PM  In your present state of health, do you have any difficulty performing the following activities:  Hearing? 0  Vision? 0  Difficulty concentrating or making decisions? 0  Walking or climbing stairs? 0  Dressing or bathing? 0  Doing errands, shopping? 0  Preparing Food and eating ? N  Using the Toilet? N  In the past six months, have you accidently leaked urine? N  Do you have problems with loss of bowel control? N  Managing your Medications? N  Managing your Finances? N  Housekeeping or managing your Housekeeping? N    Patient Care Team:  Clarise Crooks, MD as PCP - General (Family Medicine) Dingeldein, Landon Pinion, MD (Ophthalmology)  Indicate any recent Medical Services you may have received from other than [facility] providers in the past year (date may be approximate).     Assessment:   This is a routine wellness examination for [facility].  Hearing/Vision screen Hearing Screening - Comments:: Denies hearing loss Vision Screening - Comments:: Gets eye exams, Dr. Janeice Medal, [facility]   Goals Addressed             This Visit's Progress    Patient Stated       Lose 5 lbs       Depression Screen     01/02/2024    3:23 PM 12/27/2022    3:29 PM 12/08/2022    2:30 PM 05/18/2022    2:25 PM 05/09/2022    2:30 PM 12/12/2021    3:38 PM 05/30/2021    3:23 PM  PHQ 2/9 Scores  PHQ - 2 Score 0 0 0 0 0 0 0  PHQ- 9 Score 0 0 0 0 0  0    Fall Risk     01/02/2024    3:26 PM 12/27/2022    3:32 PM 12/08/2022    2:30 PM 05/18/2022    2:25 PM 05/09/2022    2:30 PM  Fall Risk   Falls in the past year? 0 0 0 0 0  Number falls in past yr: 0 0 0 0 0  Injury with Fall? 0 0 0 0 0  Risk for fall due to : No Fall Risks No Fall Risks No Fall Risks  No Fall Risks  Follow up Falls prevention discussed;Falls evaluation completed Falls prevention discussed;Falls evaluation completed Falls evaluation completed   Falls evaluation completed    MEDICARE RISK AT HOME:  Medicare Risk at Home Any stairs in or around the home?: Yes If so, are there any without handrails?: No Home free of loose throw rugs in walkways, pet beds, electrical cords, etc?: Yes Adequate lighting in your home to reduce risk of falls?: Yes Life alert?: No Use of a cane, walker or w/c?: No Grab bars in the bathroom?: No Shower chair or bench in shower?: No Elevated toilet seat or a handicapped toilet?: No  TIMED UP AND GO:  Was the test performed?  No  Cognitive Function: 6CIT completed        01/02/2024    3:27 PM 12/27/2022    3:34 PM 04/24/2018    1:31 PM 04/23/2017   11:04 AM  6CIT Screen  What Year? 0 points 0 points 0 points 0 points  What month? 0 points 0 points 0 points 0 points  What time? 0 points 0 points 0 points 0 points  Count back from 20 0 points 0 points 0 points 0 points  Months in reverse 0 points 0 points 0 points 0 points  Repeat phrase 0 points 2 points 0 points 0 points  Total Score 0 points 2 points 0 points 0 points    Immunizations Immunization History  Administered Date(s) Administered   PFIZER(Purple Top)SARS-COV-2 Vaccination 09/29/2019, 10/20/2019, 06/25/2020, 06/20/2021, 06/11/2022    Screening Tests Health Maintenance  Topic Date Due   Hepatitis C Screening  Never done   Pneumonia Vaccine 66+ Years old (1 of 1 - PCV) Never done   Zoster Vaccines- Shingrix (1 of 2) Never done   COVID-19 Vaccine (6 - 2024-25 season) 05/13/2023   Medicare Annual Wellness (AWV)  01/01/2025   DEXA  SCAN  Completed   HPV VACCINES  Aged Out   Meningococcal B Vaccine  Aged Out   DTaP/Tdap/Td  Discontinued   INFLUENZA VACCINE  Discontinued    Health Maintenance  Health Maintenance Due  Topic Date Due   Hepatitis C Screening  Never done   Pneumonia Vaccine 38+ Years old (1 of 1 - PCV) Never done   Zoster Vaccines- Shingrix (1 of 2) Never done   COVID-19 Vaccine (6 - 2024-25 season)  05/13/2023   Health Maintenance Items Addressed: See Nurse Notes  Additional Screening:  Vision Screening: Recommended annual ophthalmology exams for early detection of glaucoma and other disorders of the eye.  Dental Screening: Recommended annual dental exams for proper oral hygiene  Community Resource Referral / Chronic Care Management: CRR required this visit?  No   CCM required this visit?  No     Plan:     I have personally reviewed and noted the following in the patient's chart:   Medical and social history Use of alcohol, tobacco or illicit drugs  Current medications and supplements including opioid prescriptions. Patient is not currently taking opioid prescriptions. Functional ability and status Nutritional status Physical activity Advanced directives List of other physicians Hospitalizations, surgeries, and ER visits in previous 12 months Vitals Screenings to include cognitive, depression, and falls Referrals and appointments  In addition, I have reviewed and discussed with patient certain preventive protocols, quality metrics, and best practice recommendations. A written personalized care plan for preventive services as well as general preventive health recommendations were provided to patient.     Jaunita Messier, CMA   01/02/2024   After Visit Summary: (MyChart) Due to this being a telephonic visit, the after visit summary with patients personalized plan was offered to patient via MyChart   Notes:  Declined all vaccines Declined Hep C Screening Made appt to establish with Dr. Cari Char for 01/11/24

## 2024-01-02 NOTE — Patient Instructions (Addendum)
 Heidi Bauer , Thank you for taking time to come for your Medicare Wellness Visit. I appreciate your ongoing commitment to your health goals. Please review the following plan we discussed and let me know if I can assist you in the future.   Referrals/Orders/Follow-Ups/Clinician Recommendations: I have made you an appointment to establish care with Dr. Barnetta Liberty for 01/11/24 @ 1:40pm (arrive 15 minutes to get registered)  This is a list of the screening recommended for you and due dates:  Health Maintenance  Topic Date Due   Hepatitis C Screening  Never done   Pneumonia Vaccine (1 of 1 - PCV) Never done   Zoster (Shingles) Vaccine (1 of 2) Never done   COVID-19 Vaccine (6 - 2024-25 season) 05/13/2023   Medicare Annual Wellness Visit  01/01/2025   DEXA scan (bone density measurement)  01/18/2025   HPV Vaccine  Aged Out   Meningitis B Vaccine  Aged Out   DTaP/Tdap/Td vaccine  Discontinued   Flu Shot  Discontinued    Advanced directives: (Copy Requested) Please bring a copy of your health care power of attorney and living will to the office to be added to your chart at your convenience. You can mail to [facility] [address] [place], [place] [address] or email to [email or web address]  Next Medicare Annual Wellness Visit scheduled for next year: Yes, 01/07/25 @ 3:20pm (phone visit)

## 2024-01-11 ENCOUNTER — Ambulatory Visit (INDEPENDENT_AMBULATORY_CARE_PROVIDER_SITE_OTHER): Admitting: Student

## 2024-01-11 ENCOUNTER — Encounter: Payer: Self-pay | Admitting: Student

## 2024-01-11 VITALS — BP 140/86 | HR 74 | Ht 65.0 in | Wt 147.5 lb

## 2024-01-11 DIAGNOSIS — M8580 Other specified disorders of bone density and structure, unspecified site: Secondary | ICD-10-CM | POA: Insufficient documentation

## 2024-01-11 DIAGNOSIS — Z0000 Encounter for general adult medical examination without abnormal findings: Secondary | ICD-10-CM | POA: Insufficient documentation

## 2024-01-11 DIAGNOSIS — R030 Elevated blood-pressure reading, without diagnosis of hypertension: Secondary | ICD-10-CM | POA: Diagnosis not present

## 2024-01-11 DIAGNOSIS — M8588 Other specified disorders of bone density and structure, other site: Secondary | ICD-10-CM | POA: Diagnosis not present

## 2024-01-11 DIAGNOSIS — M1711 Unilateral primary osteoarthritis, right knee: Secondary | ICD-10-CM

## 2024-01-11 DIAGNOSIS — Z90710 Acquired absence of both cervix and uterus: Secondary | ICD-10-CM | POA: Insufficient documentation

## 2024-01-11 DIAGNOSIS — E785 Hyperlipidemia, unspecified: Secondary | ICD-10-CM | POA: Insufficient documentation

## 2024-01-11 DIAGNOSIS — E782 Mixed hyperlipidemia: Secondary | ICD-10-CM | POA: Diagnosis not present

## 2024-01-11 NOTE — Assessment & Plan Note (Addendum)
 Last Dexa 01/2020 with osteopenia currently on vitamin D and calcium supplementation. Check vitamin D today, She declined repeating Dexa today

## 2024-01-11 NOTE — Assessment & Plan Note (Signed)
 Elevated in the last, discussed diet recommendations. Lipid panel today.

## 2024-01-11 NOTE — Assessment & Plan Note (Addendum)
 Lab work ordered today.  Doing well with ADLs and iADLs

## 2024-01-11 NOTE — Assessment & Plan Note (Signed)
 Using voltaren  gel, pain is well controlled and not function limiting at this time.

## 2024-01-11 NOTE — Assessment & Plan Note (Signed)
 BP mildly elevated today, goal <140/90 given age. Not currently on any medication. BP well controlled in the past. Discussed eating less sodium as she has been eating more cured meats lately. Reports home BP measurements are around 140/78. Will have her continue monitor BP at home and follow up if BP are more elevated.

## 2024-01-11 NOTE — Progress Notes (Addendum)
 Complete physical exam  Patient: Heidi Bauer   DOB: 28-Sep-1945   78 y.o. Female  MRN: [id number]  Subjective:    Chief Complaint  Patient presents with   Establish Care    Heidi Bauer is a 78 y.o. female who presents today for a complete physical exam. She reports consuming a general diet. Home exercise routine includes walking and doing whotes. She generally feels well. She reports sleeping well. She does not have additional problems to discuss today.   Reports she is doing well today, not having much knee pain. She lives and active life, denies issues ADLs or iADLs.   Vision:Within last year  Patient Active Problem List   Diagnosis Date Noted   Osteopenia 01/11/2024   History of total abdominal hysterectomy 01/11/2024   Elevated BP without diagnosis of hypertension 01/11/2024   Annual physical exam 01/11/2024   Hyperlipidemia 01/11/2024   Primary osteoarthritis of right knee 05/18/2022   Pes anserinus bursitis of right knee 05/18/2022      Patient Care Team: Clarise Crooks, MD as PCP - General (Family Medicine) Bertis Brochure, MD (Ophthalmology)   Outpatient Medications Prior to Visit  Medication Sig   Calcium Carbonate-Vitamin D (CALCIUM 500 + D) 500-125 MG-UNIT TABS Take by mouth.   [DISCONTINUED] diclofenac  (VOLTAREN ) 50 MG EC tablet TAKE 1 TABLET BY MOUTH 2 TIMES DAILY AS NEEDED (Patient not taking: Reported on 01/11/2024)   [DISCONTINUED] guaiFENesin -codeine  (ROBITUSSIN AC) 100-10 MG/5ML syrup Take 5 mLs by mouth 3 (three) times daily as needed for cough. (Patient not taking: Reported on 01/11/2024)   No facility-administered medications prior to visit.    ROS Refer to HPI     Objective:    BP (!) 140/86   Pulse 74   Ht 5\' 5"  (1.651 m)   Wt 147 lb 8 oz (66.9 kg)   SpO2 96%   BMI 24.55 kg/m  BP Readings from Last 3 Encounters:  01/11/24 (!) 140/86  12/08/22 124/76  07/03/22 122/82    Physical Exam Constitutional:      Appearance: Normal  appearance.  Cardiovascular:     Rate and Rhythm: Normal rate and regular rhythm.     Heart sounds: No murmur heard.    Comments: No carotid bruit Pulmonary:     Effort: Pulmonary effort is normal.     Breath sounds: No rhonchi or rales.  Abdominal:     General: Abdomen is flat. Bowel sounds are normal. There is no distension.     Palpations: Abdomen is soft.     Tenderness: There is no abdominal tenderness.  Musculoskeletal:        General: Normal range of motion.     Right lower leg: No edema.     Left lower leg: No edema.     Comments: Crepitus of the right knee, no swelling or joint effusion  Skin:    General: Skin is warm and dry.     Capillary Refill: Capillary refill takes less than 2 seconds.  Neurological:     General: No focal deficit present.     Mental Status: She is alert and oriented to person, place, and time.  Psychiatric:        Mood and Affect: Mood normal.        Behavior: Behavior normal.         01/02/2024    3:23 PM 12/27/2022    3:29 PM 12/08/2022    2:30 PM  Depression screen PHQ 2/9  Decreased  Interest 0 0 0  Down, Depressed, Hopeless 0 0 0  PHQ - 2 Score 0 0 0  Altered sleeping 0 0 0  Tired, decreased energy 0 0 0  Change in appetite 0 0 0  Feeling bad or failure about yourself  0 0 0  Trouble concentrating 0 0 0  Moving slowly or fidgety/restless 0 0 0  Suicidal thoughts 0 0 0  PHQ-9 Score 0 0 0  Difficult doing work/chores Not difficult at all Not difficult at all Not difficult at all      12/08/2022    2:31 PM 05/18/2022    2:25 PM 05/09/2022    2:30 PM 05/30/2021    3:23 PM  GAD 7 : Generalized Anxiety Score  Nervous, Anxious, on Edge 0 0 0 0  Control/stop worrying 0 0 0 0  Worry too much - different things 0 0 0 0  Trouble relaxing 0 0 0 0  Restless 0 0 0 0  Easily annoyed or irritable 0 0 0 0  Afraid - awful might happen 0 0 0 0  Total GAD 7 Score 0 0 0 0  Anxiety Difficulty Not difficult at all Not difficult at all Not difficult  at all     Last metabolic panel Lab Results  Component Value Date   GLUCOSE 97 05/29/2022   NA 145 (H) 05/29/2022   K 5.3 (H) 05/29/2022   CL 107 (H) 05/29/2022   CO2 26 05/29/2022   BUN 18 05/29/2022   CREATININE 1.04 (H) 05/29/2022   EGFR 56 (L) 05/29/2022   CALCIUM 10.0 05/29/2022   PHOS 3.2 05/29/2022   ALBUMIN 4.3 05/29/2022   Last lipids Lab Results  Component Value Date   CHOL 248 (H) 05/29/2022   HDL 67 05/29/2022   LDLCALC 163 (H) 05/29/2022   TRIG 105 05/29/2022   Last hemoglobin A1c No results found for: "HGBA1C"      Assessment & Plan:    Routine Health Maintenance and Physical Exam  Health Maintenance  Topic Date Due   Hepatitis C Screening  Never done   Pneumonia Vaccine (1 of 1 - PCV) Never done   Zoster (Shingles) Vaccine (1 of 2) Never done   COVID-19 Vaccine (6 - 2024-25 season) 01/27/2024*   Medicare Annual Wellness Visit  01/01/2025   DEXA scan (bone density measurement)  01/18/2025   HPV Vaccine  Aged Out   Meningitis B Vaccine  Aged Out   DTaP/Tdap/Td vaccine  Discontinued   Flu Shot  Discontinued  *Topic was postponed. The date shown is not the original due date.    Discussed health benefits of physical activity, and encouraged her to engage in regular exercise appropriate for her age and condition.  Annual physical exam Assessment & Plan: Lab work ordered today.  Doing well with ADLs and iADLs  Orders: -     CBC -     Comprehensive metabolic panel with GFR  Osteopenia of lumbar spine Assessment & Plan: Last Dexa 01/2020 with osteopenia currently on vitamin D and calcium supplementation. Check vitamin D today, She declined repeating Dexa today   Orders: -     VITAMIN D 25 Hydroxy (Vit-D Deficiency, Fractures)  Moderate mixed hyperlipidemia not requiring statin therapy Assessment & Plan: Elevated in the last, discussed diet recommendations. Lipid panel today.   Orders: -     Lipid panel  Elevated BP without diagnosis of  hypertension Assessment & Plan: BP mildly elevated today, goal <140/90 given age. Not  currently on any medication. BP well controlled in the past. Discussed eating less sodium as she has been eating more cured meats lately. Reports home BP measurements are around 140/78. Will have her continue monitor BP at home and follow up if BP are more elevated.    Primary osteoarthritis of right knee Assessment & Plan: Using voltaren  gel, pain is well controlled and not function limiting at this time.      Return in about 1 year (around 01/10/2025) for physical.     Barnetta Liberty, MD

## 2024-01-12 LAB — CBC
Hematocrit: 42.8 % (ref 34.0–46.6)
Hemoglobin: 13.6 g/dL (ref 11.1–15.9)
MCH: 28.7 pg (ref 26.6–33.0)
MCHC: 31.8 g/dL (ref 31.5–35.7)
MCV: 90 fL (ref 79–97)
Platelets: 280 10*3/uL (ref 150–450)
RBC: 4.74 x10E6/uL (ref 3.77–5.28)
RDW: 13.8 % (ref 11.7–15.4)
WBC: 5.2 10*3/uL (ref 3.4–10.8)

## 2024-01-12 LAB — COMPREHENSIVE METABOLIC PANEL WITH GFR
ALT: 13 IU/L (ref 0–32)
AST: 18 IU/L (ref 0–40)
Albumin: 4.1 g/dL (ref 3.8–4.8)
Alkaline Phosphatase: 74 IU/L (ref 44–121)
BUN/Creatinine Ratio: 17 (ref 12–28)
BUN: 18 mg/dL (ref 8–27)
Bilirubin Total: 0.3 mg/dL (ref 0.0–1.2)
CO2: 20 mmol/L (ref 20–29)
Calcium: 9.8 mg/dL (ref 8.7–10.3)
Chloride: 106 mmol/L (ref 96–106)
Creatinine, Ser: 1.06 mg/dL — ABNORMAL HIGH (ref 0.57–1.00)
Globulin, Total: 2.3 g/dL (ref 1.5–4.5)
Glucose: 79 mg/dL (ref 70–99)
Potassium: 4.5 mmol/L (ref 3.5–5.2)
Sodium: 141 mmol/L (ref 134–144)
Total Protein: 6.4 g/dL (ref 6.0–8.5)
eGFR: 54 mL/min/{1.73_m2} — ABNORMAL LOW (ref >59)

## 2024-01-12 LAB — LIPID PANEL
Chol/HDL Ratio: 4.1 ratio (ref 0.0–4.4)
Cholesterol, Total: 253 mg/dL — ABNORMAL HIGH (ref 100–199)
HDL: 62 mg/dL (ref >39)
LDL Chol Calc (NIH): 174 mg/dL — ABNORMAL HIGH (ref 0–99)
Triglycerides: 99 mg/dL (ref 0–149)
VLDL Cholesterol Cal: 17 mg/dL (ref 5–40)

## 2024-01-12 LAB — VITAMIN D 25 HYDROXY (VIT D DEFICIENCY, FRACTURES): Vit D, 25-Hydroxy: 10 ng/mL — ABNORMAL LOW (ref 30.0–100.0)

## 2024-01-14 ENCOUNTER — Other Ambulatory Visit: Payer: Self-pay | Admitting: Student

## 2024-01-14 DIAGNOSIS — E559 Vitamin D deficiency, unspecified: Secondary | ICD-10-CM | POA: Insufficient documentation

## 2025-01-07 ENCOUNTER — Ambulatory Visit

## 2025-01-12 ENCOUNTER — Encounter: Admitting: Student
